# Patient Record
Sex: Male | Born: 1980 | Race: White | Hispanic: No | Marital: Single | Smoking: Current every day smoker
Health system: Southern US, Community
[De-identification: ages and names within clinical notes are randomized; demographics above are authoritative.]

## PROBLEM LIST (undated history)

## (undated) DIAGNOSIS — F1910 Other psychoactive substance abuse, uncomplicated: Secondary | ICD-10-CM

## (undated) DIAGNOSIS — F329 Major depressive disorder, single episode, unspecified: Secondary | ICD-10-CM

## (undated) DIAGNOSIS — K7460 Unspecified cirrhosis of liver: Secondary | ICD-10-CM

## (undated) DIAGNOSIS — M549 Dorsalgia, unspecified: Secondary | ICD-10-CM

## (undated) DIAGNOSIS — R0789 Other chest pain: Secondary | ICD-10-CM

## (undated) DIAGNOSIS — T148XXA Other injury of unspecified body region, initial encounter: Secondary | ICD-10-CM

## (undated) DIAGNOSIS — G8929 Other chronic pain: Secondary | ICD-10-CM

## (undated) DIAGNOSIS — F32A Depression, unspecified: Secondary | ICD-10-CM

## (undated) DIAGNOSIS — M25569 Pain in unspecified knee: Secondary | ICD-10-CM

---

## 2003-08-09 ENCOUNTER — Emergency Department (HOSPITAL_COMMUNITY): Admission: EM | Admit: 2003-08-09 | Discharge: 2003-08-09 | Payer: Self-pay | Admitting: Emergency Medicine

## 2004-11-17 ENCOUNTER — Emergency Department (HOSPITAL_COMMUNITY): Admission: EM | Admit: 2004-11-17 | Discharge: 2004-11-17 | Payer: Self-pay | Admitting: Emergency Medicine

## 2004-12-07 ENCOUNTER — Emergency Department (HOSPITAL_COMMUNITY): Admission: EM | Admit: 2004-12-07 | Discharge: 2004-12-07 | Payer: Self-pay | Admitting: *Deleted

## 2006-01-22 ENCOUNTER — Emergency Department (HOSPITAL_COMMUNITY): Admission: EM | Admit: 2006-01-22 | Discharge: 2006-01-22 | Payer: Self-pay | Admitting: Emergency Medicine

## 2006-07-16 ENCOUNTER — Emergency Department (HOSPITAL_COMMUNITY): Admission: EM | Admit: 2006-07-16 | Discharge: 2006-07-16 | Payer: Self-pay | Admitting: Emergency Medicine

## 2007-01-12 ENCOUNTER — Emergency Department (HOSPITAL_COMMUNITY): Admission: EM | Admit: 2007-01-12 | Discharge: 2007-01-12 | Payer: Self-pay | Admitting: Emergency Medicine

## 2007-04-11 ENCOUNTER — Emergency Department (HOSPITAL_COMMUNITY): Admission: EM | Admit: 2007-04-11 | Discharge: 2007-04-11 | Payer: Self-pay | Admitting: Emergency Medicine

## 2007-04-12 ENCOUNTER — Emergency Department (HOSPITAL_COMMUNITY): Admission: EM | Admit: 2007-04-12 | Discharge: 2007-04-12 | Payer: Self-pay | Admitting: Emergency Medicine

## 2007-04-17 ENCOUNTER — Emergency Department (HOSPITAL_COMMUNITY): Admission: EM | Admit: 2007-04-17 | Discharge: 2007-04-17 | Payer: Self-pay | Admitting: Emergency Medicine

## 2007-04-22 ENCOUNTER — Emergency Department (HOSPITAL_COMMUNITY): Admission: EM | Admit: 2007-04-22 | Discharge: 2007-04-22 | Payer: Self-pay | Admitting: Emergency Medicine

## 2007-07-15 ENCOUNTER — Emergency Department (HOSPITAL_COMMUNITY): Admission: EM | Admit: 2007-07-15 | Discharge: 2007-07-15 | Payer: Self-pay | Admitting: Emergency Medicine

## 2007-08-20 ENCOUNTER — Emergency Department (HOSPITAL_COMMUNITY): Admission: EM | Admit: 2007-08-20 | Discharge: 2007-08-20 | Payer: Self-pay | Admitting: Emergency Medicine

## 2007-09-17 ENCOUNTER — Emergency Department (HOSPITAL_COMMUNITY): Admission: EM | Admit: 2007-09-17 | Discharge: 2007-09-17 | Payer: Self-pay | Admitting: Emergency Medicine

## 2008-08-19 ENCOUNTER — Emergency Department (HOSPITAL_COMMUNITY): Admission: EM | Admit: 2008-08-19 | Discharge: 2008-08-19 | Payer: Self-pay | Admitting: Emergency Medicine

## 2008-08-22 ENCOUNTER — Emergency Department (HOSPITAL_COMMUNITY): Admission: EM | Admit: 2008-08-22 | Discharge: 2008-08-22 | Payer: Self-pay | Admitting: Emergency Medicine

## 2008-09-10 ENCOUNTER — Emergency Department (HOSPITAL_COMMUNITY): Admission: EM | Admit: 2008-09-10 | Discharge: 2008-09-10 | Payer: Self-pay | Admitting: Emergency Medicine

## 2008-09-16 ENCOUNTER — Emergency Department (HOSPITAL_COMMUNITY): Admission: EM | Admit: 2008-09-16 | Discharge: 2008-09-16 | Payer: Self-pay | Admitting: Emergency Medicine

## 2008-09-18 ENCOUNTER — Emergency Department (HOSPITAL_COMMUNITY): Admission: EM | Admit: 2008-09-18 | Discharge: 2008-09-18 | Payer: Self-pay | Admitting: Emergency Medicine

## 2009-01-05 ENCOUNTER — Emergency Department (HOSPITAL_COMMUNITY): Admission: EM | Admit: 2009-01-05 | Discharge: 2009-01-05 | Payer: Self-pay | Admitting: Emergency Medicine

## 2009-01-17 ENCOUNTER — Emergency Department (HOSPITAL_COMMUNITY): Admission: EM | Admit: 2009-01-17 | Discharge: 2009-01-17 | Payer: Self-pay | Admitting: Emergency Medicine

## 2009-02-02 ENCOUNTER — Emergency Department (HOSPITAL_COMMUNITY): Admission: EM | Admit: 2009-02-02 | Discharge: 2009-02-02 | Payer: Self-pay | Admitting: Emergency Medicine

## 2009-02-14 ENCOUNTER — Emergency Department (HOSPITAL_COMMUNITY): Admission: EM | Admit: 2009-02-14 | Discharge: 2009-02-14 | Payer: Self-pay | Admitting: Emergency Medicine

## 2009-02-17 ENCOUNTER — Emergency Department (HOSPITAL_COMMUNITY): Admission: EM | Admit: 2009-02-17 | Discharge: 2009-02-17 | Payer: Self-pay | Admitting: Emergency Medicine

## 2009-02-26 ENCOUNTER — Emergency Department (HOSPITAL_COMMUNITY): Admission: EM | Admit: 2009-02-26 | Discharge: 2009-02-26 | Payer: Self-pay | Admitting: Emergency Medicine

## 2009-04-05 ENCOUNTER — Emergency Department (HOSPITAL_COMMUNITY): Admission: EM | Admit: 2009-04-05 | Discharge: 2009-04-05 | Payer: Self-pay | Admitting: Emergency Medicine

## 2009-04-09 ENCOUNTER — Emergency Department (HOSPITAL_COMMUNITY): Admission: EM | Admit: 2009-04-09 | Discharge: 2009-04-09 | Payer: Self-pay | Admitting: Emergency Medicine

## 2009-04-10 ENCOUNTER — Emergency Department (HOSPITAL_COMMUNITY): Admission: EM | Admit: 2009-04-10 | Discharge: 2009-04-10 | Payer: Self-pay | Admitting: Emergency Medicine

## 2009-07-26 ENCOUNTER — Emergency Department (HOSPITAL_COMMUNITY): Admission: EM | Admit: 2009-07-26 | Discharge: 2009-07-26 | Payer: Self-pay | Admitting: Emergency Medicine

## 2009-08-24 ENCOUNTER — Emergency Department (HOSPITAL_COMMUNITY): Admission: EM | Admit: 2009-08-24 | Discharge: 2009-08-24 | Payer: Self-pay | Admitting: Emergency Medicine

## 2010-02-24 ENCOUNTER — Emergency Department (HOSPITAL_COMMUNITY): Admission: EM | Admit: 2010-02-24 | Discharge: 2010-02-24 | Payer: Self-pay | Admitting: Emergency Medicine

## 2010-05-18 ENCOUNTER — Emergency Department (HOSPITAL_COMMUNITY): Admission: EM | Admit: 2010-05-18 | Discharge: 2010-05-18 | Payer: Self-pay | Admitting: Emergency Medicine

## 2010-07-08 ENCOUNTER — Emergency Department (HOSPITAL_COMMUNITY)
Admission: EM | Admit: 2010-07-08 | Discharge: 2010-07-08 | Payer: BC Managed Care – PPO | Source: Home / Self Care | Admitting: Emergency Medicine

## 2010-07-26 ENCOUNTER — Emergency Department (HOSPITAL_COMMUNITY)
Admission: EM | Admit: 2010-07-26 | Discharge: 2010-07-26 | Disposition: A | Discharge status: Home / Self Care | Payer: Self-pay | Attending: Emergency Medicine | Admitting: Emergency Medicine

## 2010-07-26 DIAGNOSIS — G8929 Other chronic pain: Secondary | ICD-10-CM | POA: Insufficient documentation

## 2010-07-26 DIAGNOSIS — S335XXA Sprain of ligaments of lumbar spine, initial encounter: Secondary | ICD-10-CM | POA: Insufficient documentation

## 2010-07-26 DIAGNOSIS — X500XXA Overexertion from strenuous movement or load, initial encounter: Secondary | ICD-10-CM | POA: Insufficient documentation

## 2010-07-26 DIAGNOSIS — Y9269 Other specified industrial and construction area as the place of occurrence of the external cause: Secondary | ICD-10-CM | POA: Insufficient documentation

## 2010-10-01 LAB — CULTURE, ROUTINE-ABSCESS

## 2010-10-26 ENCOUNTER — Emergency Department (HOSPITAL_COMMUNITY)
Admission: EM | Admit: 2010-10-26 | Discharge: 2010-10-26 | Disposition: A | Discharge status: Home / Self Care | Payer: Self-pay | Attending: Emergency Medicine | Admitting: Emergency Medicine

## 2010-10-26 DIAGNOSIS — X503XXA Overexertion from repetitive movements, initial encounter: Secondary | ICD-10-CM | POA: Insufficient documentation

## 2010-10-26 DIAGNOSIS — S335XXA Sprain of ligaments of lumbar spine, initial encounter: Secondary | ICD-10-CM | POA: Insufficient documentation

## 2010-11-05 ENCOUNTER — Emergency Department (HOSPITAL_COMMUNITY): Payer: Self-pay

## 2010-11-05 ENCOUNTER — Emergency Department (HOSPITAL_COMMUNITY)
Admission: EM | Admit: 2010-11-05 | Discharge: 2010-11-05 | Disposition: A | Discharge status: Home / Self Care | Payer: Self-pay | Attending: Emergency Medicine | Admitting: Emergency Medicine

## 2010-11-05 DIAGNOSIS — S93409A Sprain of unspecified ligament of unspecified ankle, initial encounter: Secondary | ICD-10-CM | POA: Insufficient documentation

## 2010-11-05 DIAGNOSIS — Y9269 Other specified industrial and construction area as the place of occurrence of the external cause: Secondary | ICD-10-CM | POA: Insufficient documentation

## 2010-11-05 DIAGNOSIS — W138XXA Fall from, out of or through other building or structure, initial encounter: Secondary | ICD-10-CM | POA: Insufficient documentation

## 2010-12-16 ENCOUNTER — Emergency Department (HOSPITAL_COMMUNITY)
Admission: EM | Admit: 2010-12-16 | Discharge: 2010-12-16 | Disposition: A | Discharge status: Home / Self Care | Payer: Self-pay | Attending: Emergency Medicine | Admitting: Emergency Medicine

## 2010-12-16 ENCOUNTER — Emergency Department (HOSPITAL_COMMUNITY): Payer: Self-pay

## 2010-12-16 DIAGNOSIS — IMO0002 Reserved for concepts with insufficient information to code with codable children: Secondary | ICD-10-CM | POA: Insufficient documentation

## 2010-12-16 DIAGNOSIS — M545 Low back pain, unspecified: Secondary | ICD-10-CM | POA: Insufficient documentation

## 2010-12-16 DIAGNOSIS — W1789XA Other fall from one level to another, initial encounter: Secondary | ICD-10-CM | POA: Insufficient documentation

## 2010-12-16 DIAGNOSIS — Y9269 Other specified industrial and construction area as the place of occurrence of the external cause: Secondary | ICD-10-CM | POA: Insufficient documentation

## 2010-12-16 DIAGNOSIS — K7460 Unspecified cirrhosis of liver: Secondary | ICD-10-CM | POA: Insufficient documentation

## 2011-01-04 ENCOUNTER — Encounter: Payer: Self-pay | Admitting: *Deleted

## 2011-01-04 ENCOUNTER — Emergency Department (HOSPITAL_COMMUNITY)
Admission: EM | Admit: 2011-01-04 | Discharge: 2011-01-04 | Disposition: A | Discharge status: Home / Self Care | Payer: Self-pay | Attending: Emergency Medicine | Admitting: Emergency Medicine

## 2011-01-04 DIAGNOSIS — F17200 Nicotine dependence, unspecified, uncomplicated: Secondary | ICD-10-CM | POA: Insufficient documentation

## 2011-01-04 DIAGNOSIS — M545 Low back pain, unspecified: Secondary | ICD-10-CM | POA: Insufficient documentation

## 2011-01-04 DIAGNOSIS — M549 Dorsalgia, unspecified: Secondary | ICD-10-CM

## 2011-01-04 MED ORDER — NAPROXEN 500 MG PO TABS: 500.0000 mg | ORAL_TABLET | Freq: Two times a day (BID) | ORAL | Status: DC

## 2011-01-04 MED ORDER — NAPROXEN 250 MG PO TABS
500.0000 mg | ORAL_TABLET | Freq: Once | ORAL | Status: AC
  Administered 2011-01-04: 500 mg via ORAL
  Filled 2011-01-04: qty 2

## 2011-01-04 NOTE — ED Notes (Signed)
Pt awaiting md, reports having low back pain for unknown length of time, unknown injury

## 2011-01-04 NOTE — ED Provider Notes (Signed)
History     Chief Complaint  Patient presents with  . Back Pain    pt c/o low back pain   Patient is a 30 y.o. male presenting with back pain. The history is provided by the patient.  Back Pain  This is a new problem. The current episode started 12 to 24 hours ago. The problem occurs constantly. 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 The pain is present in the lumbar spine. The quality of the pain is described as aching. The pain does not radiate. The pain is at a severity of 7/10. The pain is moderate. The symptoms are aggravated by bending and certain positions. Associated symptoms include numbness. Pertinent negatives include no headaches, no bowel incontinence, no perianal numbness, no bladder incontinence, no leg pain, no paresthesias, no paresis and no tingling. He has tried nothing for the symptoms.    History reviewed. No pertinent past medical history.  History reviewed. No pertinent past surgical history.  History reviewed. No pertinent family history.  History  Substance Use Topics  . Smoking status: Current Everyday Smoker  . Smokeless tobacco: Not on file  . Alcohol Use: Yes      Review of Systems  Gastrointestinal: Negative for bowel incontinence.  Genitourinary: Negative for bladder incontinence.  Musculoskeletal: Positive for back pain.  Neurological: Positive for numbness. Negative for tingling, headaches and paresthesias.  All other systems reviewed and are negative.    Physical Exam  BP 135/82  Pulse 85  Temp 98.6 F (37 C)  Resp 20  Ht 5\' 9"  (1.753 m)  Wt 160 lb (72.576 kg)  BMI 23.63 kg/m2  SpO2 99%  Physical Exam  Constitutional: He is oriented to person, place, and time. He appears well-developed and well-nourished.  HENT:  Head: Normocephalic and atraumatic.  Eyes: EOM are normal.  Neck: Normal range of motion. Neck supple.   Cardiovascular: Normal rate and normal heart sounds.   Pulmonary/Chest: Effort normal.  Abdominal: Soft.  Musculoskeletal: Normal range of motion.       Back with no spinal pain to percussion or palpation. Paraspinal muscle tenderness in lumbar region  Neurological: He is oriented to person, place, and time.  Skin: Skin is warm.    ED Course  Procedures  MDM       Nicoletta Dress. Colon Branch, MD 01/04/11 1610

## 2011-01-11 ENCOUNTER — Emergency Department (HOSPITAL_COMMUNITY)
Admission: EM | Admit: 2011-01-11 | Discharge: 2011-01-11 | Disposition: A | Discharge status: Home / Self Care | Payer: Self-pay | Attending: Emergency Medicine | Admitting: Emergency Medicine

## 2011-01-11 ENCOUNTER — Encounter (HOSPITAL_COMMUNITY): Payer: Self-pay | Admitting: *Deleted

## 2011-01-11 DIAGNOSIS — K089 Disorder of teeth and supporting structures, unspecified: Secondary | ICD-10-CM | POA: Insufficient documentation

## 2011-01-11 DIAGNOSIS — F17200 Nicotine dependence, unspecified, uncomplicated: Secondary | ICD-10-CM | POA: Insufficient documentation

## 2011-01-11 DIAGNOSIS — K0889 Other specified disorders of teeth and supporting structures: Secondary | ICD-10-CM

## 2011-01-11 DIAGNOSIS — K029 Dental caries, unspecified: Secondary | ICD-10-CM | POA: Insufficient documentation

## 2011-01-11 MED ORDER — AMOXICILLIN 250 MG PO CAPS
1000.0000 mg | ORAL_CAPSULE | Freq: Once | ORAL | Status: AC
  Administered 2011-01-11: 1000 mg via ORAL
  Filled 2011-01-11: qty 4

## 2011-01-11 MED ORDER — OXYCODONE HCL 10 MG PO TABS: 10.0000 mg | ORAL_TABLET | Freq: Two times a day (BID) | ORAL | Status: DC

## 2011-01-11 MED ORDER — OXYCODONE HCL 5 MG PO TABS
10.0000 mg | ORAL_TABLET | Freq: Once | ORAL | Status: AC
  Administered 2011-01-11: 10 mg via ORAL
  Filled 2011-01-11: qty 2

## 2011-01-11 MED ORDER — AMOXICILLIN 500 MG PO CAPS: 500.0000 mg | ORAL_CAPSULE | Freq: Three times a day (TID) | ORAL | Status: AC

## 2011-01-11 NOTE — ED Notes (Signed)
Pt c/o right upper tooth pain x 1 day

## 2011-01-11 NOTE — ED Provider Notes (Addendum)
History    Complains of pain in right upper quadrant of oral cavity. Roy [name] has known caries and poor dental health. No fever or chills or stiffnecked. Has taken nothing at home. Pain is moderate. Chief Complaint  Roy [name] presents with  . Dental Pain   Roy [name] is a 30 y.o. male presenting with tooth pain. The history is provided by the Roy [name].  Dental PainThe symptoms began yesterday.    History reviewed. No pertinent past medical history.  History reviewed. No pertinent past surgical history.  History reviewed. No pertinent family history.  History  Substance Use Topics  . Smoking status: Current Everyday Smoker  . Smokeless tobacco: Not on file  . Alcohol Use: Yes      Review of Systems  All other systems reviewed and are negative.    Physical Exam  BP 124/70  Pulse 95  Temp(Src) 98.9 F (37.2 C) (Oral)  Resp 18  Ht 5\' 9"  (1.753 m)  Wt 160 lb (72.576 kg)  BMI 23.63 kg/m2  SpO2 100%  Physical Exam  Nursing note and vitals reviewed. Constitutional: He is oriented to person, place, and time. He appears well-developed and well-nourished.  HENT:  Head: Normocephalic and atraumatic.       Poor dentition. Evidence of caries throughout mouth particularly in the right upper teeth  Eyes: Conjunctivae and EOM are normal. Pupils are equal, round, and reactive to light.  Neck: Normal range of motion. Neck supple.  Cardiovascular: Normal rate and regular rhythm.   Pulmonary/Chest: Effort normal and breath sounds normal.  Abdominal: Soft. Bowel sounds are normal.  Musculoskeletal: Normal range of motion.  Neurological: He is alert and oriented to person, place, and time.  Skin: Skin is warm and dry.  Psychiatric: He has a normal mood and affect.    ED Course  Procedures  MDM Will Rx antibiotic and pain medicine followup with dentist of choice     Donnetta Hutching, MD 01/11/11 1610  Donnetta Hutching, MD 01/29/11 1536

## 2011-01-18 ENCOUNTER — Emergency Department (HOSPITAL_COMMUNITY)
Admission: EM | Admit: 2011-01-18 | Discharge: 2011-01-18 | Disposition: A | Discharge status: Home / Self Care | Payer: Self-pay | Attending: Emergency Medicine | Admitting: Emergency Medicine

## 2011-01-18 ENCOUNTER — Encounter (HOSPITAL_COMMUNITY): Payer: Self-pay | Admitting: Emergency Medicine

## 2011-01-18 DIAGNOSIS — K029 Dental caries, unspecified: Secondary | ICD-10-CM | POA: Insufficient documentation

## 2011-01-18 DIAGNOSIS — K089 Disorder of teeth and supporting structures, unspecified: Secondary | ICD-10-CM | POA: Insufficient documentation

## 2011-01-18 DIAGNOSIS — F17200 Nicotine dependence, unspecified, uncomplicated: Secondary | ICD-10-CM | POA: Insufficient documentation

## 2011-01-18 DIAGNOSIS — K0889 Other specified disorders of teeth and supporting structures: Secondary | ICD-10-CM

## 2011-01-18 MED ORDER — OXYCODONE-ACETAMINOPHEN 5-325 MG PO TABS: 2.0000 | ORAL_TABLET | ORAL | Status: DC | PRN

## 2011-01-18 MED ORDER — OXYCODONE HCL 10 MG PO TB12: 10.0000 mg | ORAL_TABLET | Freq: Two times a day (BID) | ORAL | Status: AC

## 2011-01-18 NOTE — ED Provider Notes (Signed)
History     Chief Complaint  Patient presents with  . Dental Pain   HPI Comments: Patient was seen and given amoxicillin and oxycodone for dental pain and caries. Patien tis supposed to have an appointment with his dentist Friday. Wore shorts with a open pocket on his pants having lost his last 9 oxycodone. Here for replacement.  Patient is a 30 y.o. male presenting with tooth pain. The history is provided by the patient.  Dental PainPrimary symptoms comment: dental pain Episode onset: 2 weeks. The symptoms are unchanged.  Additional symptoms include: dental sensitivity to temperature and gum swelling.    History reviewed. No pertinent past medical history.  History reviewed. No pertinent past surgical history.  No family history on file.  History  Substance Use Topics  . Smoking status: Current Some Day Smoker  . Smokeless tobacco: Not on file  . Alcohol Use: No      Review of Systems  All other systems reviewed and are negative.    Physical Exam  BP 131/79  Pulse 73  Temp(Src) 99.1 F (37.3 C) (Oral)  Resp 16  Ht 5\' 9"  (1.753 m)  Wt 160 lb (72.576 kg)  BMI 23.63 kg/m2  SpO2 99%  Physical Exam  Nursing note and vitals reviewed. Constitutional: He is oriented to person, place, and time. He appears well-developed and well-nourished. No distress.  HENT:  Head: Normocephalic.  Mouth/Throat: Oropharynx is clear and moist.       Widespread dental caries.  Eyes: EOM are normal. Pupils are equal, round, and reactive to light.  Cardiovascular: Normal rate.   Pulmonary/Chest: Effort normal.  Abdominal: Soft.  Musculoskeletal: Normal range of motion.  Neurological: He is alert and oriented to person, place, and time.  Skin: Skin is warm and dry.  Psychiatric: He has a normal mood and affect.    ED Course  Procedures  MDM Normal policy is to not replace lost or stolen pain medicines. Patient does not have PMD, has a dentist appt on Friday. He will return to the  ER each shift if not accomodated. Will provide #9 pills.     Nicoletta Dress. Colon Branch, MD 01/18/11 [phone]

## 2011-01-18 NOTE — ED Notes (Signed)
Pt here for toothache.

## 2011-01-18 NOTE — ED Notes (Signed)
Patient c/o right upper tooth ache. 

## 2011-01-29 ENCOUNTER — Emergency Department (HOSPITAL_COMMUNITY)
Admission: EM | Admit: 2011-01-29 | Discharge: 2011-01-29 | Disposition: A | Discharge status: Home / Self Care | Payer: Self-pay | Attending: Emergency Medicine | Admitting: Emergency Medicine

## 2011-01-29 ENCOUNTER — Encounter (HOSPITAL_COMMUNITY): Payer: Self-pay | Admitting: *Deleted

## 2011-01-29 DIAGNOSIS — K0889 Other specified disorders of teeth and supporting structures: Secondary | ICD-10-CM

## 2011-01-29 DIAGNOSIS — K029 Dental caries, unspecified: Secondary | ICD-10-CM | POA: Insufficient documentation

## 2011-01-29 DIAGNOSIS — K0381 Cracked tooth: Secondary | ICD-10-CM | POA: Insufficient documentation

## 2011-01-29 DIAGNOSIS — K089 Disorder of teeth and supporting structures, unspecified: Secondary | ICD-10-CM | POA: Insufficient documentation

## 2011-01-29 DIAGNOSIS — F17200 Nicotine dependence, unspecified, uncomplicated: Secondary | ICD-10-CM | POA: Insufficient documentation

## 2011-01-29 MED ORDER — OXYCODONE HCL 5 MG PO TABS
5.0000 mg | ORAL_TABLET | Freq: Once | ORAL | Status: AC
  Administered 2011-01-29: 5 mg via ORAL
  Filled 2011-01-29: qty 1

## 2011-01-29 NOTE — ED Notes (Signed)
Pt self ambulated out with a steady gait. [facility] help

## 2011-01-29 NOTE — ED Notes (Signed)
Pt c/o right upper tooth pain; pt states the tooth has a hole in it

## 2011-01-29 NOTE — ED Provider Notes (Signed)
History     CSN: [id number] Arrival date & time: 01/29/2011  1:47 AM  Chief Complaint  Patient presents with  . Dental Pain    top right   HPI Comments: Seen 0320  Patient is a 30 y.o. male presenting with tooth pain. The history is provided by the patient.  Dental PainThe primary symptoms include mouth pain. Primary symptoms comment: dental pain right upper teeth Episode onset: months. The symptoms are unchanged. The symptoms are chronic. The symptoms occur frequently.  Additional symptoms include: dental sensitivity to temperature, gum tenderness and jaw pain. Additional symptoms do not include: trouble swallowing, dry mouth and taste disturbance.    History reviewed. No pertinent past medical history.  History reviewed. No pertinent past surgical history.  History reviewed. No pertinent family history.  History  Substance Use Topics  . Smoking status: Current Some Day Smoker  . Smokeless tobacco: Not on file  . Alcohol Use: No      Review of Systems  HENT: Negative for trouble swallowing.   All other systems reviewed and are negative.    Physical Exam  BP 131/76  Pulse 87  Temp(Src) 98.8 F (37.1 C) (Oral)  Resp 18  Ht 5\' 9"  (1.753 m)  Wt 160 lb (72.576 kg)  BMI 23.63 kg/m2  SpO2 100%  Physical Exam  Nursing note and vitals reviewed. Constitutional: He is oriented to person, place, and time. He appears well-developed and well-nourished.  HENT:  Head: Normocephalic and atraumatic.       Poor dental hygiene. Multiple caries. Broken teeth, decayed teeth   Eyes: EOM are normal.  Cardiovascular: Normal rate.   Pulmonary/Chest: Effort normal.  Musculoskeletal: Normal range of motion.  Neurological: He is alert and oriented to person, place, and time.  Skin: Skin is dry.    ED Course  Procedures  MDM Reviewed nurses notes and vital signs      Nicoletta Dress. Colon Branch, MD 01/29/11 [id number]

## 2011-02-09 ENCOUNTER — Emergency Department (HOSPITAL_COMMUNITY)
Admission: EM | Admit: 2011-02-09 | Discharge: 2011-02-09 | Disposition: A | Discharge status: Home / Self Care | Payer: Self-pay | Attending: Emergency Medicine | Admitting: Emergency Medicine

## 2011-02-09 ENCOUNTER — Encounter (HOSPITAL_COMMUNITY): Payer: Self-pay | Admitting: *Deleted

## 2011-02-09 DIAGNOSIS — K0889 Other specified disorders of teeth and supporting structures: Secondary | ICD-10-CM

## 2011-02-09 DIAGNOSIS — F17200 Nicotine dependence, unspecified, uncomplicated: Secondary | ICD-10-CM | POA: Insufficient documentation

## 2011-02-09 DIAGNOSIS — K089 Disorder of teeth and supporting structures, unspecified: Secondary | ICD-10-CM | POA: Insufficient documentation

## 2011-02-09 MED ORDER — OXYCODONE HCL 10 MG PO TABS: 1.0000 | ORAL_TABLET | Freq: Three times a day (TID) | ORAL | Status: DC

## 2011-02-09 MED ORDER — AMOXICILLIN 500 MG PO CAPS: 500.0000 mg | ORAL_CAPSULE | Freq: Three times a day (TID) | ORAL | Status: AC

## 2011-02-09 MED ORDER — OXYCODONE HCL 5 MG PO TABS
10.0000 mg | ORAL_TABLET | Freq: Once | ORAL | Status: AC
  Administered 2011-02-09: 10 mg via ORAL
  Filled 2011-02-09: qty 2

## 2011-02-09 NOTE — ED Notes (Signed)
toothache

## 2011-02-09 NOTE — ED Notes (Signed)
Pt self am out with a steady gait stating no needs

## 2011-02-09 NOTE — ED Provider Notes (Signed)
History    chief complaint dental pain History of present illness: Complains of right upper tooth pain for several weeks. Has appointment with dentist in 2 weeks and [facility]. 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 No fever, chills, stiff neck. CSN: [id number] Arrival date & time: 02/09/2011  1:28 AM  Chief Complaint  Patient presents with  . Dental Pain   HPI  History reviewed. No pertinent past medical history.  History reviewed. No pertinent past surgical history.  History reviewed. No pertinent family history.  History  Substance Use Topics  . Smoking status: Current Some Day Smoker  . Smokeless tobacco: Not on file  . Alcohol Use: No      Review of Systems  All other systems reviewed and are negative.    Physical Exam  BP 128/83  Pulse 98  Temp(Src) 99.2 F (37.3 C) (Oral)  Resp 18  Wt 160 lb (72.576 kg)  SpO2 99%  Physical Exam  Nursing note and vitals reviewed. Constitutional: He is oriented to person, place, and time. He appears well-developed and well-nourished.  HENT:  Head: Normocephalic and atraumatic.       Poor dentition throughout mouth. Tenderness in right upper teeth and gingiva.  Eyes: Conjunctivae and EOM are normal. Pupils are equal, round, and reactive to light.  Neck: Normal range of motion. Neck supple.  Musculoskeletal: Normal range of motion.  Neurological: He is alert and oriented to person, place, and time.  Skin: Skin is warm and dry.  Psychiatric: He has a normal mood and affect.    ED Course  Procedures  MDM Rx pain meds and antibiotics. Follow up with dentistry.      Donnetta Hutching, MD 02/09/11 [phone]

## 2011-03-02 ENCOUNTER — Encounter (HOSPITAL_COMMUNITY): Payer: Self-pay | Admitting: *Deleted

## 2011-03-02 ENCOUNTER — Emergency Department (HOSPITAL_COMMUNITY)
Admission: EM | Admit: 2011-03-02 | Discharge: 2011-03-02 | Disposition: A | Discharge status: Home / Self Care | Payer: Self-pay | Attending: Emergency Medicine | Admitting: Emergency Medicine

## 2011-03-02 DIAGNOSIS — K029 Dental caries, unspecified: Secondary | ICD-10-CM | POA: Insufficient documentation

## 2011-03-02 DIAGNOSIS — K0889 Other specified disorders of teeth and supporting structures: Secondary | ICD-10-CM

## 2011-03-02 DIAGNOSIS — F17200 Nicotine dependence, unspecified, uncomplicated: Secondary | ICD-10-CM | POA: Insufficient documentation

## 2011-03-02 DIAGNOSIS — K089 Disorder of teeth and supporting structures, unspecified: Secondary | ICD-10-CM | POA: Insufficient documentation

## 2011-03-02 MED ORDER — OXYCODONE HCL 5 MG PO TABS
10.0000 mg | ORAL_TABLET | Freq: Once | ORAL | Status: AC
  Administered 2011-03-02: 10 mg via ORAL
  Filled 2011-03-02: qty 2

## 2011-03-02 NOTE — ED Notes (Signed)
Toothache since yesterday.  Says he has seen a dentist and plans to have it pulled

## 2011-03-02 NOTE — ED Notes (Signed)
Toothache, rt upper tooth.

## 2011-03-02 NOTE — ED Provider Notes (Signed)
History     CSN: [id number] Arrival date & time: 03/02/2011  4:15 AM  Chief Complaint  Patient presents with  . Dental Pain   HPI Comments: Seen 0445. Patient has next dentist appointment on 03/19/11.  Patient is a 30 y.o. male presenting with tooth pain. The history is provided by the patient.  Dental PainThe primary symptoms include mouth pain. Primary symptoms comment: Patient with multiple visits to the ER for dental pain. Was seen by dentist 02/16/11. Given Rx for antibiotics and #30 oxycodone 10 mg. Finished all pills last Saturday. Ate steak last night and has renewed pain to rright upper teeth. The symptoms began 2 to 6 hours ago. The symptoms are unchanged. The symptoms are recurrent.  Additional symptoms include: dental sensitivity to temperature and gum tenderness. Additional symptoms do not include: jaw pain, facial swelling and trouble swallowing.    History reviewed. No pertinent past medical history.  History reviewed. No pertinent past surgical history.  History reviewed. No pertinent family history.  History  Substance Use Topics  . Smoking status: Current Some Day Smoker  . Smokeless tobacco: Not on file  . Alcohol Use: No      Review of Systems  HENT: Negative for facial swelling and trouble swallowing.   All other systems reviewed and are negative.    Physical Exam  BP 125/82  Pulse 110  Temp(Src) 98.7 F (37.1 C) (Oral)  Resp 20  Wt 160 lb (72.576 kg)  SpO2 96%  Physical Exam  Nursing note and vitals reviewed. Constitutional: He is oriented to person, place, and time. He appears well-developed and well-nourished.  HENT:  Head: Normocephalic and atraumatic.       Poor dental condition. RU molar broken at gumline, no obvious abscess. Widespread dental decay.  Eyes: EOM are normal.  Neck: Normal range of motion.  Cardiovascular: Normal heart sounds and intact distal pulses.   Pulmonary/Chest: Effort normal and breath sounds normal.    Musculoskeletal: Normal range of motion.  Neurological: He is alert and oriented to person, place, and time.  Skin: Skin is warm and dry.    ED Course  Procedures  Patient with multiple visits for pain due to dental caries. Recently seen by a dentist and given antibiotics and analgesics which he finished. Next appt is 03/19/11. Here for pain. Offered him a single pill here, with follow up with the dentist tomorrow.  MDM Reviewed: previous chart, nursing note and vitals        Nicoletta Dress. Colon Branch, MD 03/02/11 [phone]

## 2011-03-04 ENCOUNTER — Encounter (HOSPITAL_COMMUNITY): Payer: Self-pay | Admitting: *Deleted

## 2011-03-04 ENCOUNTER — Emergency Department (HOSPITAL_COMMUNITY)
Admission: EM | Admit: 2011-03-04 | Discharge: 2011-03-04 | Disposition: A | Discharge status: Home / Self Care | Payer: Self-pay | Attending: Emergency Medicine | Admitting: Emergency Medicine

## 2011-03-04 DIAGNOSIS — K089 Disorder of teeth and supporting structures, unspecified: Secondary | ICD-10-CM | POA: Insufficient documentation

## 2011-03-04 DIAGNOSIS — K0889 Other specified disorders of teeth and supporting structures: Secondary | ICD-10-CM

## 2011-03-04 DIAGNOSIS — K029 Dental caries, unspecified: Secondary | ICD-10-CM | POA: Insufficient documentation

## 2011-03-04 MED ORDER — OXYCODONE-ACETAMINOPHEN 5-325 MG PO TABS
1.0000 | ORAL_TABLET | Freq: Once | ORAL | Status: AC
  Administered 2011-03-04: 1 via ORAL
  Filled 2011-03-04: qty 1

## 2011-03-04 MED ORDER — PENICILLIN V POTASSIUM 500 MG PO TABS: 500.0000 mg | ORAL_TABLET | Freq: Three times a day (TID) | ORAL | Status: AC

## 2011-03-04 NOTE — ED Notes (Signed)
Toothache since Sunday

## 2011-03-04 NOTE — ED Provider Notes (Signed)
History     CSN: [id number] Arrival date & time: 03/04/2011 11:02 PM   Chief Complaint  Patient presents with  . Dental Pain     (Include location/radiation/quality/duration/timing/severity/associated sxs/prior treatment) HPI Comments: Dental pain, onset several months ago, waxes and wanes, location right upper jaw. States that he is going to see a dentist in the next week and a half, until that time has no more medication at home. He had previously been treated with oxycodone which had improved his pain. He was seen 2 days ago when offered medications in the ER but no prescriptions to go home. He has allergies to ibuprofen and Toradol causing nausea. Symptoms are moderate, not associated with swelling or fevers.  Patient is a 30 y.o. male presenting with tooth pain. The history is provided by the patient and medical records.  Dental PainPrimary symptoms do not include fever or sore throat.  Additional symptoms do not include: facial swelling and trouble swallowing.     History reviewed. No pertinent past medical history.   History reviewed. No pertinent past surgical history.  History reviewed. No pertinent family history.  History  Substance Use Topics  . Smoking status: Current Some Day Smoker  . Smokeless tobacco: Not on file  . Alcohol Use: No      Review of Systems  Constitutional: Negative for fever and chills.  HENT: Positive for dental problem. Negative for sore throat, facial swelling, trouble swallowing and voice change.        Toothache  Gastrointestinal: Negative for nausea and vomiting.    Allergies  Acetaminophen; Darvocet; Hydrocodone; Toradol; and Tramadol  Home Medications   Current Outpatient Rx  Name Route Sig Dispense Refill  . NAPROXEN 500 MG PO TABS Oral Take 1 tablet (500 mg total) by mouth 2 (two) times daily. 30 tablet 0  . OXYCODONE HCL 10 MG PO TABS Oral Take 1 tablet (10 mg total) by mouth 2 (two) times daily. 20 each 0  . OXYCODONE HCL  10 MG PO TABS Oral Take 1 tablet (10 mg total) by mouth 3 (three) times daily after meals. 25 each 0    Physical Exam    BP 140/84  Pulse 85  Temp(Src) 99.6 F (37.6 C) (Oral)  Resp 18  Wt 160 lb (72.576 kg)  SpO2 99%  Physical Exam  Nursing note and vitals reviewed. Constitutional: He appears well-developed and well-nourished. No distress.  HENT:  Head: Normocephalic and atraumatic.  Mouth/Throat: Oropharynx is clear and moist. No oropharyngeal exudate.       Dental Disease - widespread, significant, no associated abscesses. No asymmetry of the jaw  Eyes: Conjunctivae are normal. No scleral icterus.  Neck: Normal range of motion. Neck supple. No thyromegaly present.  Cardiovascular: Normal rate and regular rhythm.   Pulmonary/Chest: Effort normal and breath sounds normal.  Lymphadenopathy:    He has no cervical adenopathy.  Neurological: He is alert.  Skin: Skin is warm and dry. No rash noted. He is not diaphoretic.    ED Course  Procedures    MDM Overall well appearing with evidence of dental decay but no abscess. We'll refill antibiotic and treat with one dose of pain medication here. I have informed her that I will not give him a prescription for narcotic medications due to the amount that he is used to last 2 months without following up with his dentist.       Vida Roller, MD 03/04/11 2344

## 2011-03-08 ENCOUNTER — Encounter (HOSPITAL_COMMUNITY): Payer: Self-pay | Admitting: *Deleted

## 2011-03-08 ENCOUNTER — Emergency Department (HOSPITAL_COMMUNITY)
Admission: EM | Admit: 2011-03-08 | Discharge: 2011-03-08 | Disposition: A | Discharge status: Home / Self Care | Payer: Self-pay | Attending: Emergency Medicine | Admitting: Emergency Medicine

## 2011-03-08 DIAGNOSIS — K089 Disorder of teeth and supporting structures, unspecified: Secondary | ICD-10-CM | POA: Insufficient documentation

## 2011-03-08 DIAGNOSIS — K0889 Other specified disorders of teeth and supporting structures: Secondary | ICD-10-CM

## 2011-03-08 MED ORDER — OXYCODONE HCL 5 MG PO TABS
5.0000 mg | ORAL_TABLET | Freq: Once | ORAL | Status: AC
  Administered 2011-03-08: 5 mg via ORAL
  Filled 2011-03-08: qty 1

## 2011-03-08 MED ORDER — OXYCODONE HCL 5 MG PO TABS: 5.0000 mg | ORAL_TABLET | Freq: Four times a day (QID) | ORAL | Status: AC | PRN

## 2011-03-08 MED ORDER — CLINDAMYCIN HCL 150 MG PO CAPS
300.0000 mg | ORAL_CAPSULE | Freq: Once | ORAL | Status: AC
  Administered 2011-03-08: 300 mg via ORAL
  Filled 2011-03-08: qty 2

## 2011-03-08 MED ORDER — CLINDAMYCIN HCL 150 MG PO CAPS: 300.0000 mg | ORAL_CAPSULE | Freq: Three times a day (TID) | ORAL | Status: AC

## 2011-03-08 NOTE — ED Provider Notes (Signed)
History     CSN: [id number] Arrival date & time: 03/08/2011 12:41 AM   Chief Complaint  Patient presents with  . Dental Pain     (Include location/radiation/quality/duration/timing/severity/associated sxs/prior treatment) HPI Comments: Pt states he's been taking percocet without tylenol also taking penicillin but the oral swelling seems to be worsening.  Patient is a 30 y.o. male presenting with tooth pain. The history is provided by the patient. No language interpreter was used.  Dental PainThe primary symptoms include mouth pain. Primary symptoms do not include dental injury. The symptoms began 2 days ago. The symptoms are worsening. The symptoms occur constantly.  Additional symptoms do not include: facial swelling. Medical issues include: alcohol problem.     History reviewed. No pertinent past medical history.   History reviewed. No pertinent past surgical history.  History reviewed. No pertinent family history.  History  Substance Use Topics  . Smoking status: Current Some Day Smoker -- 0.5 packs/day    Types: Cigarettes  . Smokeless tobacco: Not on file  . Alcohol Use: No      Review of Systems  HENT: Positive for dental problem. Negative for facial swelling.   All other systems reviewed and are negative.    Allergies  Acetaminophen; Darvocet; Hydrocodone; Toradol; and Tramadol  Home Medications   Current Outpatient Rx  Name Route Sig Dispense Refill  . PENICILLIN V POTASSIUM 500 MG PO TABS Oral Take 1 tablet (500 mg total) by mouth 3 (three) times daily. 30 tablet 0  . NAPROXEN 500 MG PO TABS Oral Take 1 tablet (500 mg total) by mouth 2 (two) times daily. 30 tablet 0  . OXYCODONE HCL 10 MG PO TABS Oral Take 1 tablet (10 mg total) by mouth 2 (two) times daily. 20 each 0  . OXYCODONE HCL 10 MG PO TABS Oral Take 1 tablet (10 mg total) by mouth 3 (three) times daily after meals. 25 each 0    Physical Exam    BP 138/83  Pulse 100  Temp(Src) 97.8 F (36.6  C) (Oral)  Resp 20  Ht 5\' 9"  (1.753 m)  Wt 160 lb (72.576 kg)  BMI 23.63 kg/m2  SpO2 100%  Physical Exam  Nursing note and vitals reviewed. Constitutional: He is oriented to person, place, and time. Vital signs are normal. He appears well-developed and well-nourished. No distress.  HENT:  Head: Normocephalic and atraumatic.  Right Ear: External ear normal.  Left Ear: External ear normal.  Nose: Nose normal.  Mouth/Throat: Abnormal dentition. Dental abscesses and dental caries present. No oropharyngeal exudate.    Eyes: Conjunctivae and EOM are normal. Pupils are equal, round, and reactive to light. Right eye exhibits no discharge. Left eye exhibits no discharge. No scleral icterus.  Neck: Normal range of motion. Neck supple. No JVD present. No tracheal deviation present. No thyromegaly present.  Cardiovascular: Normal rate, regular rhythm, normal heart sounds, intact distal pulses and normal pulses.  Exam reveals no gallop and no friction rub.   No murmur heard. Pulmonary/Chest: Effort normal and breath sounds normal. No stridor. No respiratory distress. He has no wheezes. He has no rales. He exhibits no tenderness.  Abdominal: Soft. Normal appearance and bowel sounds are normal. He exhibits no distension and no mass. There is no tenderness. There is no rebound and no guarding.  Musculoskeletal: Normal range of motion. He exhibits no edema and no tenderness.  Lymphadenopathy:    He has no cervical adenopathy.  Neurological: He is alert and oriented to person,  place, and time. He has normal reflexes. No cranial nerve deficit. Coordination normal. GCS eye subscore is 4. GCS verbal subscore is 5. GCS motor subscore is 6.  Skin: Skin is warm and dry. No rash noted. He is not diaphoretic.  Psychiatric: He has a normal mood and affect. His speech is normal and behavior is normal. Judgment and thought content normal. Cognition and memory are normal.    ED Course  Procedures  Results for  orders placed during the hospital encounter of 09/16/08  CULTURE, ROUTINE-ABSCESS      Component Value Range   Specimen Description ABSCESS LEFT AXILLA     Special Requests NONE     Gram Stain       Value: MODERATE WBC PRESENT, PREDOMINANTLY PMN     NO SQUAMOUS EPITHELIAL CELLS SEEN     FEW GRAM POSITIVE COCCI     IN PAIRS IN CLUSTERS   Culture       Value: MODERATE METHICILLIN RESISTANT STAPHYLOCOCCUS AUREUS     Note: RIFAMPIN AND GENTAMICIN SHOULD NOT BE USED AS SINGLE DRUGS FOR TREATMENT OF STAPH INFECTIONS. This organism DOES NOT demonstrate inducible Clindamycin resistance in vitro. CRITICAL RESULT CALLED TO, READ BACK BY AND VERIFIED WITH: T. SILVIANO FM      04.01.10 10.50 BY PER[facility]   Report Status 09/19/2008 FINAL     Organism ID, Bacteria METHICILLIN RESISTANT STAPHYLOCOCCUS AUREUS     No results found.   No diagnosis found.   MDM        Worthy Rancher, PA 03/08/11 [phone]

## 2011-03-08 NOTE — ED Notes (Signed)
Patient with dental pain to right upper area x 1 week

## 2011-03-13 ENCOUNTER — Emergency Department (HOSPITAL_COMMUNITY)
Admission: EM | Admit: 2011-03-13 | Discharge: 2011-03-13 | Discharge status: Against Medical Advice | Payer: Self-pay | Attending: Emergency Medicine | Admitting: Emergency Medicine

## 2011-03-13 ENCOUNTER — Encounter (HOSPITAL_COMMUNITY): Payer: Self-pay | Admitting: Emergency Medicine

## 2011-03-13 DIAGNOSIS — Z888 Allergy status to other drugs, medicaments and biological substances status: Secondary | ICD-10-CM | POA: Insufficient documentation

## 2011-03-13 DIAGNOSIS — F17200 Nicotine dependence, unspecified, uncomplicated: Secondary | ICD-10-CM | POA: Insufficient documentation

## 2011-03-13 DIAGNOSIS — K089 Disorder of teeth and supporting structures, unspecified: Secondary | ICD-10-CM | POA: Insufficient documentation

## 2011-03-13 DIAGNOSIS — K0889 Other specified disorders of teeth and supporting structures: Secondary | ICD-10-CM

## 2011-03-13 MED ORDER — NAPROXEN 250 MG PO TABS
500.0000 mg | ORAL_TABLET | Freq: Once | ORAL | Status: DC
  Filled 2011-03-13: qty 2

## 2011-03-13 MED ORDER — OXYCODONE-ACETAMINOPHEN 5-325 MG PO TABS
1.0000 | ORAL_TABLET | Freq: Once | ORAL | Status: AC
  Administered 2011-03-13: 1 via ORAL

## 2011-03-13 MED ORDER — OXYCODONE-ACETAMINOPHEN 5-325 MG PO TABS
ORAL_TABLET | ORAL | Status: AC
  Filled 2011-03-13: qty 1

## 2011-03-13 NOTE — ED Notes (Signed)
Percocet not administerd due to pt allergy. Unable to correct in computer to show not given

## 2011-03-13 NOTE — ED Notes (Signed)
Pt refused to take naprosyn. Demanding oxycodone without tylenol. Explained that PA was informed of pt request and had ordered naprosyn instead. Pt states I will just take the percocet with tylenol then. Explained to pt that med could not be administered d/t allergy. Pt upset. States "Screw this. Im hurting bad. Im leaving." Pt ambulated out of faciliyt. Refused to sign for discharge instructions.

## 2011-03-13 NOTE — ED Provider Notes (Signed)
History     CSN: [id number] Arrival date & time: 03/13/2011  8:11 PM  Chief Complaint  Patient presents with  . Dental Pain    HPI  (Consider location/radiation/quality/duration/timing/severity/associated sxs/prior treatment)  Patient is a 30 y.o. male presenting with tooth pain. The history is provided by the patient.  Dental PainThe primary symptoms include mouth pain. Primary symptoms do not include shortness of breath or cough. The symptoms began more than 1 month ago. The symptoms are unchanged. The symptoms are chronic.  Additional symptoms include: dental sensitivity to temperature, gum swelling and gum tenderness. Additional symptoms do not include: nosebleeds. Medical issues include: smoking.    History reviewed. No pertinent past medical history.  History reviewed. No pertinent past surgical history.  History reviewed. No pertinent family history.  History  Substance Use Topics  . Smoking status: Current Some Day Smoker -- 0.5 packs/day    Types: Cigarettes  . Smokeless tobacco: Not on file  . Alcohol Use: No      Review of Systems  Review of Systems  Constitutional: Negative for activity change.       All ROS Neg except as noted in HPI  HENT: Positive for dental problem. Negative for nosebleeds and neck pain.   Eyes: Negative for photophobia and discharge.  Respiratory: Negative for cough, shortness of breath and wheezing.   Cardiovascular: Negative for chest pain and palpitations.  Gastrointestinal: Negative for abdominal pain and blood in stool.  Genitourinary: Negative for dysuria, frequency and hematuria.  Musculoskeletal: Negative for back pain and arthralgias.  Skin: Negative.   Neurological: Negative for dizziness, seizures and speech difficulty.  Psychiatric/Behavioral: Negative for hallucinations and confusion.    Allergies  Acetaminophen; Darvocet; Hydrocodone; Toradol; and Tramadol  Home Medications   Current Outpatient Rx  Name Route Sig  Dispense Refill  . CLINDAMYCIN HCL 150 MG PO CAPS Oral Take 2 capsules (300 mg total) by mouth 3 (three) times daily. 60 capsule 0  . OXYCODONE HCL 5 MG PO TABS Oral Take 1 tablet (5 mg total) by mouth every 6 (six) hours as needed for pain. 20 tablet 0  . OXYCODONE HCL 10 MG PO TABS Oral Take 1 tablet (10 mg total) by mouth 3 (three) times daily after meals. 25 each 0  . PENICILLIN V POTASSIUM 500 MG PO TABS Oral Take 1 tablet (500 mg total) by mouth 3 (three) times daily. 30 tablet 0  . NAPROXEN 500 MG PO TABS Oral Take 1 tablet (500 mg total) by mouth 2 (two) times daily. 30 tablet 0  . OXYCODONE HCL 10 MG PO TABS Oral Take 1 tablet (10 mg total) by mouth 2 (two) times daily. 20 each 0    Physical Exam    BP 137/82  Pulse 98  Temp(Src) 98.5 F (36.9 C) (Oral)  Resp 18  Ht 5\' 9"  (1.753 m)  Wt 160 lb (72.576 kg)  BMI 23.63 kg/m2  SpO2 100%  Physical Exam  Nursing note and vitals reviewed. Constitutional: He is oriented to person, place, and time. He appears well-developed and well-nourished.  Non-toxic appearance.  HENT:  Head: Normocephalic.  Right Ear: Tympanic membrane and external ear normal.  Left Ear: Tympanic membrane and external ear normal.       Wide spread dental caries. Mild to mod swelling of the gums.  Eyes: EOM and lids are normal. Pupils are equal, round, and reactive to light.  Neck: Normal range of motion. Neck supple. Carotid bruit is not present.  Cardiovascular:  Normal rate, regular rhythm, normal heart sounds, intact distal pulses and normal pulses.   Pulmonary/Chest: Breath sounds normal. No respiratory distress.  Abdominal: Soft. Bowel sounds are normal. There is no tenderness. There is no guarding.  Musculoskeletal: Normal range of motion.  Lymphadenopathy:       Head (right side): No submandibular adenopathy present.       Head (left side): No submandibular adenopathy present.    He has no cervical adenopathy.  Neurological: He is alert and oriented  to person, place, and time. He has normal strength. No cranial nerve deficit or sensory deficit.  Skin: Skin is warm and dry.  Psychiatric: He has a normal mood and affect. His speech is normal.    ED Course: Pt has been treated with narcotic pain medication 9/17, 9/13, 9/11, 8/21, 8/10, and 7/30. Instructed pt to finish his antibiotic. Advised pt he will need to see his dentist for dental pain management as the ED is not set up for chronic pain management.  Procedures (including critical care time)  Labs Reviewed - No data to display No results found.   1. Toothache      MDM I have reviewed nursing notes, vital signs, and all appropriate lab and imaging results for this patient.        Kathie Dike, [place] 03/13/11 2102

## 2011-03-13 NOTE — ED Notes (Signed)
Complaining of toothache to upper right side of mouth x 1 day

## 2011-03-14 DIAGNOSIS — F17200 Nicotine dependence, unspecified, uncomplicated: Secondary | ICD-10-CM | POA: Insufficient documentation

## 2011-03-14 DIAGNOSIS — K089 Disorder of teeth and supporting structures, unspecified: Secondary | ICD-10-CM | POA: Insufficient documentation

## 2011-03-15 ENCOUNTER — Emergency Department (HOSPITAL_COMMUNITY)
Admission: EM | Admit: 2011-03-15 | Discharge: 2011-03-15 | Disposition: A | Discharge status: Home / Self Care | Payer: Self-pay | Attending: Emergency Medicine | Admitting: Emergency Medicine

## 2011-03-15 ENCOUNTER — Encounter (HOSPITAL_COMMUNITY): Payer: Self-pay

## 2011-03-15 DIAGNOSIS — K047 Periapical abscess without sinus: Secondary | ICD-10-CM

## 2011-03-15 MED ORDER — BUPIVACAINE-EPINEPHRINE 0.5% -1:200000 IJ SOLN
5.0000 mL | Freq: Once | INTRAMUSCULAR | Status: AC
  Administered 2011-03-15: 25 mg

## 2011-03-15 MED ORDER — IBUPROFEN 800 MG PO TABS: 800.0000 mg | ORAL_TABLET | Freq: Three times a day (TID) | ORAL | Status: DC

## 2011-03-15 MED ORDER — HYDROCODONE-ACETAMINOPHEN 7.5-500 MG/15ML PO SOLN: 15.0000 mL | Freq: Four times a day (QID) | ORAL | Status: DC | PRN

## 2011-03-15 MED ORDER — CLINDAMYCIN HCL 150 MG PO CAPS: 150.0000 mg | ORAL_CAPSULE | Freq: Four times a day (QID) | ORAL | Status: AC

## 2011-03-15 NOTE — ED Notes (Signed)
In er for toothache pain, ran out of his pain meds friday

## 2011-03-15 NOTE — ED Provider Notes (Signed)
Medical screening examination/treatment/procedure(s) were performed by non-physician practitioner and as supervising physician I was immediately available for consultation/collaboration.  in and[facility]  Benny Lennert, MD 03/15/11 [phone]

## 2011-03-15 NOTE — ED Notes (Signed)
Pt seen in triage by dr.opitz

## 2011-03-15 NOTE — ED Provider Notes (Signed)
History     CSN: [id number] Arrival date & time: 03/15/2011 12:05 AM  Chief Complaint  Patient presents with  . Dental Pain    HPI  (Consider location/radiation/quality/duration/timing/severity/associated sxs/prior treatment)  Patient is a 30 y.o. male presenting with tooth pain. The history is provided by the patient.  Dental PainPrimary symptoms do not include sore throat.  Additional symptoms do not include: facial swelling, drooling and nosebleeds.   onset a few months ago were worse the last few days since he ran out of his oxycodone at home. States has appointment with his dentist on the 28th of this month. He is requesting narcotic pain medications as he states that the only thing that helps. And that ibuprofen upsets his stomach. No facial swelling, or difficulty opening his mouth. No difficulty breathing or swallowing. No nausea or vomiting. States it hurts to chew up put any pressure on his right upper tooth. Duration timing is constant severity is moderate to severe.  History reviewed. No pertinent past medical history.  History reviewed. No pertinent past surgical history.  No family history on file.  History  Substance Use Topics  . Smoking status: Current Some Day Smoker -- 0.5 packs/day    Types: Cigarettes  . Smokeless tobacco: Not on file  . Alcohol Use: No      Review of Systems  Review of Systems  HENT: Negative for nosebleeds, sore throat, facial swelling, drooling, mouth sores and voice change.   Musculoskeletal: Negative for joint swelling.    Allergies  Acetaminophen; Darvocet; Hydrocodone; Toradol; and Tramadol  Home Medications   Current Outpatient Rx  Name Route Sig Dispense Refill  . CLINDAMYCIN HCL 150 MG PO CAPS Oral Take 2 capsules (300 mg total) by mouth 3 (three) times daily. 60 capsule 0  . NAPROXEN 500 MG PO TABS Oral Take 1 tablet (500 mg total) by mouth 2 (two) times daily. 30 tablet 0  . OXYCODONE HCL 5 MG PO TABS Oral Take 1  tablet (5 mg total) by mouth every 6 (six) hours as needed for pain. 20 tablet 0  . OXYCODONE HCL 10 MG PO TABS Oral Take 1 tablet (10 mg total) by mouth 2 (two) times daily. 20 each 0  . OXYCODONE HCL 10 MG PO TABS Oral Take 1 tablet (10 mg total) by mouth 3 (three) times daily after meals. 25 each 0  . PENICILLIN V POTASSIUM 500 MG PO TABS Oral Take 1 tablet (500 mg total) by mouth 3 (three) times daily. 30 tablet 0    Physical Exam    BP 126/66  Pulse 98  Temp 99.1 F (37.3 C)  Resp 20  Ht 5\' 9"  (1.753 m)  Wt 160 lb (72.576 kg)  BMI 23.63 kg/m2  SpO2 100%  Physical Exam  Constitutional: He is oriented to person, place, and time. He appears well-developed and well-nourished.  HENT:  Head: Normocephalic and atraumatic.       Very poor dentition. Right upper first molar tender to palpate with caries present. There is no associated gingival swelling. No trismus.no  Associated facial swelling or fluctuance. No erythema. No lymphadenopathy.  Eyes: Conjunctivae and EOM are normal. Pupils are equal, round, and reactive to light.  Neck: Trachea normal. Neck supple. No thyromegaly present.  Cardiovascular: Normal rate, regular rhythm, S1 normal, S2 normal and normal pulses.     No systolic murmur is present   No diastolic murmur is present  Pulses:      Radial pulses are 2+  on the right side, and 2+ on the left side.  Pulmonary/Chest: Effort normal and breath sounds normal. He has no wheezes. He has no rhonchi. He has no rales. He exhibits no tenderness.  Abdominal: Soft. Normal appearance and bowel sounds are normal. There is no tenderness. There is no CVA tenderness and negative Murphy's sign.  Neurological: He is alert and oriented to person, place, and time. He has normal strength. No cranial nerve deficit or sensory deficit. GCS eye subscore is 4. GCS verbal subscore is 5. GCS motor subscore is 6.  Skin: Skin is warm and dry. No rash noted. He is not diaphoretic.  Psychiatric: His  speech is normal.       Cooperative and appropriate    ED Course  Dental Date/Time: 03/15/2011 12:17 AM Performed by: Sunnie Nielsen Authorized by: Sunnie Nielsen Consent: Verbal consent obtained. Risks and benefits: risks, benefits and alternatives were discussed Consent given by: patient Patient understanding: patient states understanding of the procedure being performed Patient consent: the patient's understanding of the procedure matches consent given Procedure consent: procedure consent matches procedure scheduled Patient identity confirmed: verbally with patient Time out: Immediately prior to procedure a "time out" was called to verify the correct patient, procedure, equipment, support staff and site/side marked as required. Local anesthesia used: yes Anesthesia: local infiltration Local anesthetic: bupivacaine 0.5% with epinephrine Anesthetic total: 2 ml Patient tolerance: Patient tolerated the procedure well with no immediate complications. Comments: Right upper first molar block. Pain improved and patient tolerated well. No complications   (including critical care time)  Labs Reviewed - No data to display No results found.   No diagnosis found.   MDM Dental pain with history of the same as exam consistent with abscess. Dental block performed as above with pain improved. Records reviewed patient has received a significant amount of narcotic pain medications. He states is the thing that works. He is given a prescription for antibiotics, minimal amount of narcotic pain medications to take at home, ibuprofen with recommendations to take with food, and patient agrees to keep his scheduled followup with his dentist on the 28th her dental cleaning and extraction of this tooth.        Sunnie Nielsen, MD 03/15/11 [phone]

## 2011-03-15 NOTE — ED Notes (Signed)
Pt initially refused to have tooth numbed stated he wanted pain meds instead, pt agreed to numbing meds after long discussion w. md

## 2011-03-15 NOTE — ED Notes (Signed)
Toothache top right x 1 month

## 2011-03-21 ENCOUNTER — Encounter (HOSPITAL_COMMUNITY): Payer: Self-pay | Admitting: *Deleted

## 2011-03-21 ENCOUNTER — Emergency Department (HOSPITAL_COMMUNITY)
Admission: EM | Admit: 2011-03-21 | Discharge: 2011-03-21 | Discharge status: Against Medical Advice | Payer: Self-pay | Attending: *Deleted | Admitting: *Deleted

## 2011-03-21 DIAGNOSIS — K089 Disorder of teeth and supporting structures, unspecified: Secondary | ICD-10-CM | POA: Insufficient documentation

## 2011-03-21 NOTE — ED Notes (Signed)
Went in room to see pt and pt not in room

## 2011-03-21 NOTE — ED Notes (Signed)
Went in room to see pt and pt not in room 

## 2011-03-21 NOTE — ED Notes (Signed)
Pt c/o pain to tooth on the top right side of mouth.

## 2011-03-23 NOTE — ED Provider Notes (Signed)
Medical screening examination/treatment/procedure(s) were performed by non-physician practitioner and as supervising physician I was immediately available for consultation/collaboration.  Nicoletta Dress. Colon Branch, MD 03/23/11 1610

## 2011-03-25 ENCOUNTER — Encounter (HOSPITAL_COMMUNITY): Payer: Self-pay | Admitting: *Deleted

## 2011-03-25 ENCOUNTER — Emergency Department (HOSPITAL_COMMUNITY)
Admission: EM | Admit: 2011-03-25 | Discharge: 2011-03-26 | Disposition: A | Discharge status: Home / Self Care | Payer: Self-pay | Attending: Emergency Medicine | Admitting: Emergency Medicine

## 2011-03-25 DIAGNOSIS — K047 Periapical abscess without sinus: Secondary | ICD-10-CM | POA: Insufficient documentation

## 2011-03-25 DIAGNOSIS — K0889 Other specified disorders of teeth and supporting structures: Secondary | ICD-10-CM

## 2011-03-25 DIAGNOSIS — K089 Disorder of teeth and supporting structures, unspecified: Secondary | ICD-10-CM | POA: Insufficient documentation

## 2011-03-25 MED ORDER — OXYCODONE-ACETAMINOPHEN 5-325 MG PO TABS
1.0000 | ORAL_TABLET | Freq: Once | ORAL | Status: AC
  Administered 2011-03-26: 1 via ORAL
  Filled 2011-03-25: qty 1

## 2011-03-25 MED ORDER — OXYCODONE-ACETAMINOPHEN 5-325 MG PO TABS: 1.0000 | ORAL_TABLET | ORAL | Status: AC | PRN

## 2011-03-25 NOTE — ED Provider Notes (Signed)
History     CSN: [id number] Arrival date & time: 03/25/2011 10:54 PM  Chief Complaint  Patient presents with  . Dental Pain    (Consider location/radiation/quality/duration/timing/severity/associated sxs/prior treatment) Patient is a 30 y.o. male presenting with tooth pain. The history is provided by the patient.  Dental PainThe primary symptoms include mouth pain. Primary symptoms do not include oral bleeding, oral lesions, headaches, fever, shortness of breath, sore throat, angioedema or cough. The symptoms began more than 1 week ago. The symptoms are worsening. The symptoms are chronic. The symptoms occur constantly.  Mouth pain occurs constantly. Mouth pain is worsening. Affected locations include: teeth.  Additional symptoms include: dental sensitivity to temperature, gum swelling and gum tenderness. Additional symptoms do not include: purulent gums, trismus, jaw pain, facial swelling, trouble swallowing, pain with swallowing, ear pain and fatigue. Medical issues include: smoking and periodontal disease.    History reviewed. No pertinent past medical history.  History reviewed. No pertinent past surgical history.  History reviewed. No pertinent family history.  History  Substance Use Topics  . Smoking status: Current Some Day Smoker -- 0.5 packs/day    Types: Cigarettes  . Smokeless tobacco: Not on file  . Alcohol Use: No      Review of Systems  Constitutional: Negative for fever, chills and fatigue.  HENT: Positive for dental problem. Negative for ear pain, sore throat, facial swelling, rhinorrhea, sneezing, trouble swallowing, neck pain and neck stiffness.   Respiratory: Negative for cough, shortness of breath and wheezing.   Cardiovascular: Negative for chest pain and palpitations.  Gastrointestinal: Negative for nausea, vomiting, abdominal pain and blood in stool.  Genitourinary: Negative for dysuria, hematuria and flank pain.  Musculoskeletal: Negative for myalgias,  back pain and arthralgias.  Skin: Negative for rash.  Neurological: Negative for dizziness, weakness, numbness and headaches.  Hematological: Negative for adenopathy. Does not bruise/bleed easily.  All other systems reviewed and are negative.    Allergies  Acetaminophen; Darvocet; Hydrocodone; Toradol; and Tramadol  Home Medications   Current Outpatient Rx  Name Route Sig Dispense Refill  . CLINDAMYCIN HCL 150 MG PO CAPS Oral Take 1 capsule (150 mg total) by mouth every 6 (six) hours. 28 capsule 0    BP 153/86  Pulse 101  Temp(Src) 98.8 F (37.1 C) (Oral)  Resp 12  SpO2 100%  Physical Exam  Nursing note and vitals reviewed. Constitutional: He is oriented to person, place, and time. He appears well-developed and well-nourished. No distress.  HENT:  Head: Normocephalic and atraumatic. No trismus in the jaw.  Mouth/Throat: Uvula is midline, oropharynx is clear and moist and mucous membranes are normal. Abnormal dentition. Dental caries present. No dental abscesses or uvula swelling.    Neck: Normal range of motion. Neck supple.  Cardiovascular: Normal rate, regular rhythm and normal heart sounds.   Pulmonary/Chest: Effort normal and breath sounds normal. No respiratory distress. He exhibits no tenderness.  Abdominal: Soft. He exhibits no distension. There is no tenderness.  Musculoskeletal: Normal range of motion. He exhibits no tenderness.  Lymphadenopathy:    He has no cervical adenopathy.  Neurological: He is alert and oriented to person, place, and time. No cranial nerve deficit. He exhibits normal muscle tone. Coordination normal.  Skin: Skin is warm and dry.    ED Course  Procedures (including critical care time)       MDM   11:21 PM  widespread dental decay and discoloration of the remaining teeth.  ttp of the right upper gums with  mild swelling surrounding the right upper second premolar and first molar. No obvious dental abscess at this time.    Patient  has been seen here multiple times recently for dental pain and states he doesn't have money to f/u with a dentist.  I have discussed with patient that he will need further f/u with a dentist, that ER cannot manage his chronic dental pain.  Patient verbalized understanding.        Ayanni Tun L. Spencer, [place] 03/25/11 2332

## 2011-03-25 NOTE — ED Notes (Signed)
Pt reports increased pain to top rt tooth, pt reports tooth has been chipped for a while and pain is worse tonight

## 2011-03-28 NOTE — ED Provider Notes (Signed)
Medical screening examination/treatment/procedure(s) were performed by non-physician practitioner and as supervising physician I was immediately available for consultation/collaboration.  Sissy Goetzke S. Marlow Berenguer, MD 03/28/11 2103 

## 2011-03-31 ENCOUNTER — Encounter (HOSPITAL_COMMUNITY): Payer: Self-pay | Admitting: *Deleted

## 2011-03-31 ENCOUNTER — Emergency Department (HOSPITAL_COMMUNITY)
Admission: EM | Admit: 2011-03-31 | Discharge: 2011-03-31 | Disposition: A | Discharge status: Home / Self Care | Payer: Self-pay | Attending: Emergency Medicine | Admitting: Emergency Medicine

## 2011-03-31 DIAGNOSIS — F17200 Nicotine dependence, unspecified, uncomplicated: Secondary | ICD-10-CM | POA: Insufficient documentation

## 2011-03-31 DIAGNOSIS — K0381 Cracked tooth: Secondary | ICD-10-CM | POA: Insufficient documentation

## 2011-03-31 DIAGNOSIS — K029 Dental caries, unspecified: Secondary | ICD-10-CM | POA: Insufficient documentation

## 2011-03-31 DIAGNOSIS — K089 Disorder of teeth and supporting structures, unspecified: Secondary | ICD-10-CM | POA: Insufficient documentation

## 2011-03-31 DIAGNOSIS — K0889 Other specified disorders of teeth and supporting structures: Secondary | ICD-10-CM

## 2011-03-31 MED ORDER — OXYCODONE HCL 5 MG PO TABS
10.0000 mg | ORAL_TABLET | Freq: Once | ORAL | Status: AC
  Administered 2011-03-31: 10 mg via ORAL
  Filled 2011-03-31: qty 2

## 2011-03-31 NOTE — ED Notes (Signed)
Reports continued pain in right upper tooth.  Reports pain has increased tonight.

## 2011-03-31 NOTE — ED Notes (Signed)
Patient with dental pain for 2 months, c/o pain

## 2011-03-31 NOTE — ED Provider Notes (Addendum)
History     CSN: [id number] Arrival date & time: 03/31/2011  2:28 AM  Chief Complaint  Patient presents with  . Dental Pain    (Consider location/radiation/quality/duration/timing/severity/associated sxs/prior treatment) HPI Comments: Seen 0243. Patient with multiple visits to the ER for dental pain. He has not seen a dentist due to financial constraints. Last seen 03/25/11 and given #6 percocet.  Patient is a 30 y.o. male presenting with tooth pain. The history is provided by the patient.  Dental PainThe primary symptoms include mouth pain. Episode onset: patient has multiple back teeth. Unable to see dentist due to financial constraints. The symptoms are worsening. The symptoms are chronic. The symptoms occur constantly.  Additional symptoms include: dental sensitivity to temperature. Additional symptoms do not include: gum swelling and gum tenderness.    History reviewed. No pertinent past medical history.  History reviewed. No pertinent past surgical history.  History reviewed. No pertinent family history.  History  Substance Use Topics  . Smoking status: Current Some Day Smoker -- 0.5 packs/day    Types: Cigarettes  . Smokeless tobacco: Not on file  . Alcohol Use: No      Review of Systems  HENT:       Tooth pain  All other systems reviewed and are negative.    Allergies  Acetaminophen; Darvocet; Hydrocodone; Toradol; and Tramadol  Home Medications   Current Outpatient Rx  Name Route Sig Dispense Refill  . OXYCODONE-ACETAMINOPHEN 5-325 MG PO TABS Oral Take 1 tablet by mouth every 4 (four) hours as needed for pain. 6 tablet 0    BP 134/76  Pulse 90  Temp(Src) 98.5 F (36.9 C) (Oral)  Resp 16  Ht 5\' 9"  (1.753 m)  Wt 160 lb (72.576 kg)  BMI 23.63 kg/m2  SpO2 100%  Physical Exam  Nursing note and vitals reviewed. Constitutional: He is oriented to person, place, and time. He appears well-developed and well-nourished.  HENT:  Head: Normocephalic and  atraumatic.       Multiple broken teeth, dental caries. No swollen gums. ttt upper right teeth  Eyes: EOM are normal.  Neck: Normal range of motion.  Cardiovascular: Normal rate, normal heart sounds and intact distal pulses.   Pulmonary/Chest: Effort normal and breath sounds normal.  Musculoskeletal: Normal range of motion.  Lymphadenopathy:    He has no cervical adenopathy.  Neurological: He is alert and oriented to person, place, and time.  Skin: Skin is warm and dry.    ED Course  Procedures (including critical care time)  Patient with dental pain. Again reinforced that ER is not management for chronic pain. Given dental referral information again. MDM Reviewed: nursing note and vitals          Nicoletta Dress. Colon Branch, MD 03/31/11 Donnal Debar  Nicoletta Dress. Colon Branch, MD 03/31/11 [id number]

## 2011-03-31 NOTE — ED Notes (Signed)
Toothache for months 

## 2011-04-01 ENCOUNTER — Encounter (HOSPITAL_COMMUNITY): Payer: Self-pay | Admitting: *Deleted

## 2011-04-01 ENCOUNTER — Emergency Department (HOSPITAL_COMMUNITY)
Admission: EM | Admit: 2011-04-01 | Discharge: 2011-04-01 | Disposition: A | Discharge status: Home / Self Care | Payer: Self-pay | Attending: Emergency Medicine | Admitting: Emergency Medicine

## 2011-04-01 DIAGNOSIS — K0381 Cracked tooth: Secondary | ICD-10-CM | POA: Insufficient documentation

## 2011-04-01 DIAGNOSIS — K7460 Unspecified cirrhosis of liver: Secondary | ICD-10-CM | POA: Insufficient documentation

## 2011-04-01 DIAGNOSIS — F17200 Nicotine dependence, unspecified, uncomplicated: Secondary | ICD-10-CM | POA: Insufficient documentation

## 2011-04-01 DIAGNOSIS — K029 Dental caries, unspecified: Secondary | ICD-10-CM | POA: Insufficient documentation

## 2011-04-01 HISTORY — DX: Unspecified cirrhosis of liver: K74.60

## 2011-04-01 MED ORDER — OXYCODONE HCL 5 MG PO TABS
10.0000 mg | ORAL_TABLET | Freq: Once | ORAL | Status: AC
  Administered 2011-04-01: 10 mg via ORAL
  Filled 2011-04-01: qty 2

## 2011-04-01 MED ORDER — OXYCODONE HCL 5 MG PO TABS
5.0000 mg | ORAL_TABLET | Freq: Once | ORAL | Status: AC
  Administered 2011-04-01: 5 mg via ORAL
  Filled 2011-04-01: qty 1

## 2011-04-01 NOTE — ED Notes (Signed)
Upper rt toothache for 2 mos

## 2011-04-01 NOTE — ED Notes (Signed)
Pt states has had tooth ache x 2 months off and on, pain has worsened over past several days.  Pt states has dental appt next Friday with family member to pay the bill. Pt has no swelling at site, however tooth is broken with a dental carrie.

## 2011-04-01 NOTE — ED Provider Notes (Signed)
History     CSN: [id number] Arrival date & time: 04/01/2011  8:08 PM  Chief Complaint  Patient presents with  . Dental Pain    (Consider location/radiation/quality/duration/timing/severity/associated sxs/prior treatment) Patient is a 30 y.o. [name] presenting with tooth pain. The history is provided by the patient.  Dental PainThe primary symptoms include mouth pain. Primary symptoms do not include headaches, fever, shortness of breath or sore throat. Primary symptoms comment: Patient presents with poor dentition,  with complaint of increased chronic pain in his right upper 1st molar tooth.  He is scheduled to see a dentist  next week. The symptoms began more than 1 month ago. The symptoms are worsening. The symptoms are chronic. The symptoms occur constantly.  Additional symptoms include: gum tenderness. Additional symptoms do not include: gum swelling and purulent gums.    Past Medical History  Diagnosis Date  . Cirrhosis     History reviewed. No pertinent past surgical history.  History reviewed. No pertinent family history.  History  Substance Use Topics  . Smoking status: Current Some Day Smoker -- 0.5 packs/day    Types: Cigarettes  . Smokeless tobacco: Not on file  . Alcohol Use: No      Review of Systems  Constitutional: Negative for fever.  HENT: Positive for dental problem. Negative for congestion, sore throat and neck pain.   Eyes: Negative.   Respiratory: Negative for chest tightness and shortness of breath.   Cardiovascular: Negative for chest pain.  Gastrointestinal: Negative for nausea and abdominal pain.  Genitourinary: Negative.   Musculoskeletal: Negative for joint swelling and arthralgias.  Skin: Negative.  Negative for rash and wound.  Neurological: Negative for dizziness, weakness, light-headedness, numbness and headaches.  Hematological: Negative.   Psychiatric/Behavioral: Negative.     Allergies  Acetaminophen; Darvocet; Hydrocodone; Toradol;  and Tramadol  Home Medications   Current Outpatient Rx  Name Route Sig Dispense Refill  . CLINDAMYCIN HCL 150 MG PO CAPS Oral Take 150 mg by mouth 3 (three) times daily.      . OXYCODONE-ACETAMINOPHEN 5-325 MG PO TABS Oral Take 1 tablet by mouth every 4 (four) hours as needed for pain. 6 tablet 0    BP 135/83  Pulse 93  Temp(Src) 99.2 F (37.3 C) (Oral)  SpO2 100%  Physical Exam  Constitutional: He is oriented to person, place, and time. He appears well-developed and well-nourished. No distress.  HENT:  Head: Normocephalic and atraumatic.  Right Ear: Tympanic membrane and external ear normal.  Left Ear: Tympanic membrane and external ear normal.  Mouth/Throat: Oropharynx is clear and moist and mucous membranes are normal. No oral lesions. Dental caries present. No dental abscesses.    Eyes: Conjunctivae are normal.  Neck: Normal range of motion. Neck supple.  Cardiovascular: Normal rate and normal heart sounds.   Pulmonary/Chest: Effort normal.  Abdominal: He exhibits no distension.  Musculoskeletal: Normal range of motion.  Lymphadenopathy:    He has no cervical adenopathy.  Neurological: He is alert and oriented to person, place, and time.  Skin: Skin is warm and dry. No erythema.  Psychiatric: He has a normal mood and affect.    ED Course  Procedures (including critical care time)  Patient given one oxycodone tablet and stressed that the ed cannot continue to treat this chronic pain,  He needs to see a dentist for this problem.  1. Dental decay       MDM  Multiple recent visits for dental pain with narcotic prescriptions obtained.   Has  had 11 ed visits in past 2 months.  Most recently seen here last night.   Patient appears stable,  And is currently taking clindamycin from last visit here on 03/25/11.           Roy Musa, PA 04/01/11 2109

## 2011-04-01 NOTE — ED Provider Notes (Signed)
Medical screening examination/treatment/procedure(s) were performed by non-physician practitioner and as supervising physician I was immediately available for consultation/collaboration.  Nicholes Stairs, MD 04/01/11 [phone]

## 2011-04-01 NOTE — ED Provider Notes (Signed)
History     CSN: [id number] Arrival date & time: 04/01/2011 12:15 AM  Chief Complaint  Patient presents with  . Dental Pain    (Consider location/radiation/quality/duration/timing/severity/associated sxs/prior treatment) Patient is a 30 y.o. male presenting with tooth pain. The history is provided by the patient.  Dental PainThe primary symptoms include mouth pain. Primary symptoms do not include shortness of breath or cough. The symptoms began more than 1 month ago. The symptoms are unchanged. The symptoms occur constantly.  Additional symptoms include: dental sensitivity to temperature, gum swelling, gum tenderness and jaw pain. Additional symptoms do not include: nosebleeds. Medical issues include: smoking. Medical issues do not include: cancer.    History reviewed. No pertinent past medical history.  History reviewed. No pertinent past surgical history.  History reviewed. No pertinent family history.  History  Substance Use Topics  . Smoking status: Current Some Day Smoker -- 0.5 packs/day    Types: Cigarettes  . Smokeless tobacco: Not on file  . Alcohol Use: No      Review of Systems  Constitutional: Negative for activity change.       All ROS Neg except as noted in HPI  HENT: Positive for dental problem. Negative for nosebleeds and neck pain.   Eyes: Negative for photophobia and discharge.  Respiratory: Negative for cough, shortness of breath and wheezing.   Cardiovascular: Negative for chest pain and palpitations.  Gastrointestinal: Negative for abdominal pain and blood in stool.  Genitourinary: Negative for dysuria, frequency and hematuria.  Musculoskeletal: Negative for back pain and arthralgias.  Skin: Negative.   Neurological: Negative for dizziness, seizures and speech difficulty.  Psychiatric/Behavioral: Negative for hallucinations and confusion.    Allergies  Acetaminophen; Darvocet; Hydrocodone; Toradol; and Tramadol  Home Medications   Current  Outpatient Rx  Name Route Sig Dispense Refill  . OXYCODONE-ACETAMINOPHEN 5-325 MG PO TABS Oral Take 1 tablet by mouth every 4 (four) hours as needed for pain. 6 tablet 0    BP 145/77  Pulse 82  Temp(Src) 98.7 F (37.1 C) (Oral)  Resp 16  Ht 5\' 9"  (1.753 m)  Wt 160 lb (72.576 kg)  BMI 23.63 kg/m2  SpO2 100%  Physical Exam  Nursing note and vitals reviewed. Constitutional: He is oriented to person, place, and time. He appears well-developed and well-nourished.  Non-toxic appearance.  HENT:  Head: Normocephalic.  Right Ear: Tympanic membrane and external ear normal.  Left Ear: Tympanic membrane and external ear normal.       Multiple dental caries. No visible abscess.  Eyes: EOM and lids are normal. Pupils are equal, round, and reactive to light.  Neck: Normal range of motion. Neck supple. Carotid bruit is not present.  Cardiovascular: Normal rate, regular rhythm, normal heart sounds, intact distal pulses and normal pulses.   Pulmonary/Chest: Breath sounds normal. No respiratory distress.  Abdominal: Soft. Bowel sounds are normal. There is no tenderness. There is no guarding.  Musculoskeletal: Normal range of motion.  Lymphadenopathy:       Head (right side): No submandibular adenopathy present.       Head (left side): No submandibular adenopathy present.    He has no cervical adenopathy.  Neurological: He is alert and oriented to person, place, and time. He has normal strength. No cranial nerve deficit or sensory deficit.  Skin: Skin is warm and dry.  Psychiatric: He has a normal mood and affect. His speech is normal.    ED Course: Pt was advised that he must see a dentist  for management of his dental problem.  Procedures (including critical care time)  Labs Reviewed - No data to display No results found.   1. Dental caries       MDM  I have reviewed nursing notes, vital signs, and all appropriate lab and imaging results for this patient.        Kathie Dike, [place] 04/01/11 [phone]

## 2011-04-04 ENCOUNTER — Emergency Department (HOSPITAL_COMMUNITY)
Admission: EM | Admit: 2011-04-04 | Discharge: 2011-04-04 | Disposition: A | Discharge status: Home / Self Care | Payer: Self-pay | Attending: Emergency Medicine | Admitting: Emergency Medicine

## 2011-04-04 ENCOUNTER — Encounter (HOSPITAL_COMMUNITY): Payer: Self-pay | Admitting: *Deleted

## 2011-04-04 DIAGNOSIS — K089 Disorder of teeth and supporting structures, unspecified: Secondary | ICD-10-CM | POA: Insufficient documentation

## 2011-04-04 DIAGNOSIS — F17200 Nicotine dependence, unspecified, uncomplicated: Secondary | ICD-10-CM | POA: Insufficient documentation

## 2011-04-04 DIAGNOSIS — K0381 Cracked tooth: Secondary | ICD-10-CM | POA: Insufficient documentation

## 2011-04-04 DIAGNOSIS — K0889 Other specified disorders of teeth and supporting structures: Secondary | ICD-10-CM

## 2011-04-04 DIAGNOSIS — K029 Dental caries, unspecified: Secondary | ICD-10-CM | POA: Insufficient documentation

## 2011-04-04 MED ORDER — OXYCODONE HCL 5 MG PO TABS
10.0000 mg | ORAL_TABLET | Freq: Once | ORAL | Status: AC
  Administered 2011-04-04: 10 mg via ORAL
  Filled 2011-04-04: qty 2

## 2011-04-04 NOTE — ED Provider Notes (Signed)
History     CSN: [id number] Arrival date & time: 04/04/2011  1:18 AM  Chief Complaint  Patient presents with  . Dental Pain    (Consider location/radiation/quality/duration/timing/severity/associated sxs/prior treatment) HPI Comments: Seen 27. Patient with multiple dental caries who has been to the ER  Almost daily for pain medicine He has not sought care with a dentist despite having been given information and referral data. He is currently still taking clindamycin for a previus visit. He has made no attempt to use OTC medicines for pain. Last few visits has received single does analgesic in the ER without Rx. He is here seeking a Rx .   Past Medical History  Diagnosis Date  . Cirrhosis     History reviewed. No pertinent past surgical history.  No family history on file.  History  Substance Use Topics  . Smoking status: Current Some Day Smoker -- 0.5 packs/day    Types: Cigarettes  . Smokeless tobacco: Not on file  . Alcohol Use: No      Review of Systems  HENT:       Dental pain  All other systems reviewed and are negative.    Allergies  Acetaminophen; Darvocet; Hydrocodone; Toradol; and Tramadol  Home Medications   Current Outpatient Rx  Name Route Sig Dispense Refill  . CLINDAMYCIN HCL 150 MG PO CAPS Oral Take 150 mg by mouth 3 (three) times daily.      . OXYCODONE-ACETAMINOPHEN 5-325 MG PO TABS Oral Take 1 tablet by mouth every 4 (four) hours as needed for pain. 6 tablet 0    BP 132/64  Pulse 77  Temp(Src) 99 F (37.2 C) (Oral)  Resp 18  Ht 5\' 9"  (1.753 m)  Wt 160 lb (72.576 kg)  BMI 23.63 kg/m2  SpO2 100%  Physical Exam  Vitals reviewed. Constitutional: He is oriented to person, place, and time. He appears well-developed and well-nourished. No distress.  HENT:  Mouth/Throat: Oropharynx is clear and moist.       Many dental caries, poor dental hygiene, many dental fractures.   Eyes: EOM are normal.  Neck: Normal range of motion.    Cardiovascular: Normal rate, normal heart sounds and intact distal pulses.   Pulmonary/Chest: Effort normal and breath sounds normal.  Musculoskeletal: Normal range of motion.  Neurological: He is alert and oriented to person, place, and time.  Skin: Skin is warm.    ED Course  Procedures (including critical care time)    MDM  Patient with poor dental health, here for pain. Multiple previous visits for the same. No active attempt on his part to obtain care with a dentist.Will provide single dose analgesic here in the ER as we have been doing for several visits. Again provided resource information.Pt stable in ED with no significant deterioration in condition. MDM Reviewed: previous chart, nursing note and vitals           Nicoletta Dress. Colon Branch, MD 04/04/11 [phone]

## 2011-04-04 NOTE — ED Notes (Signed)
Pt reports dental pain worsening tonight

## 2011-04-07 ENCOUNTER — Emergency Department (HOSPITAL_COMMUNITY)
Admission: EM | Admit: 2011-04-07 | Discharge: 2011-04-07 | Disposition: A | Discharge status: Home / Self Care | Payer: Self-pay | Attending: Emergency Medicine | Admitting: Emergency Medicine

## 2011-04-07 DIAGNOSIS — K089 Disorder of teeth and supporting structures, unspecified: Secondary | ICD-10-CM | POA: Insufficient documentation

## 2011-04-07 DIAGNOSIS — K0889 Other specified disorders of teeth and supporting structures: Secondary | ICD-10-CM

## 2011-04-13 ENCOUNTER — Emergency Department (HOSPITAL_COMMUNITY): Payer: Self-pay

## 2011-04-13 ENCOUNTER — Emergency Department (HOSPITAL_COMMUNITY)
Admission: EM | Admit: 2011-04-13 | Discharge: 2011-04-13 | Disposition: A | Discharge status: Home / Self Care | Payer: Self-pay | Attending: Emergency Medicine | Admitting: Emergency Medicine

## 2011-04-13 ENCOUNTER — Encounter (HOSPITAL_COMMUNITY): Payer: Self-pay | Admitting: *Deleted

## 2011-04-13 DIAGNOSIS — IMO0002 Reserved for concepts with insufficient information to code with codable children: Secondary | ICD-10-CM | POA: Insufficient documentation

## 2011-04-13 DIAGNOSIS — Y92410 Unspecified street and highway as the place of occurrence of the external cause: Secondary | ICD-10-CM | POA: Insufficient documentation

## 2011-04-13 DIAGNOSIS — K7460 Unspecified cirrhosis of liver: Secondary | ICD-10-CM | POA: Insufficient documentation

## 2011-04-13 DIAGNOSIS — M549 Dorsalgia, unspecified: Secondary | ICD-10-CM | POA: Insufficient documentation

## 2011-04-13 DIAGNOSIS — F29 Unspecified psychosis not due to a substance or known physiological condition: Secondary | ICD-10-CM | POA: Insufficient documentation

## 2011-04-13 DIAGNOSIS — S82143A Displaced bicondylar fracture of unspecified tibia, initial encounter for closed fracture: Secondary | ICD-10-CM

## 2011-04-13 DIAGNOSIS — F17200 Nicotine dependence, unspecified, uncomplicated: Secondary | ICD-10-CM | POA: Insufficient documentation

## 2011-04-13 DIAGNOSIS — S82109A Unspecified fracture of upper end of unspecified tibia, initial encounter for closed fracture: Secondary | ICD-10-CM | POA: Insufficient documentation

## 2011-04-13 LAB — BASIC METABOLIC PANEL
BUN: 9 mg/dL (ref 6–23)
Chloride: 100 mEq/L (ref 96–112)
GFR calc Af Amer: >90 mL/min (ref >90)
Glucose, Bld: 107 mg/dL — ABNORMAL HIGH (ref 70–99)
Potassium: 3.6 mEq/L (ref 3.5–5.1)

## 2011-04-13 LAB — CBC
HCT: 40.5 % (ref 39.0–52.0)
Hemoglobin: 13.5 g/dL (ref 13.0–17.0)
RDW: 12.8 % (ref 11.5–15.5)
WBC: 8.5 10*3/uL (ref 4.0–10.5)

## 2011-04-13 MED ORDER — HYDROMORPHONE HCL 2 MG/ML IJ SOLN
2.0000 mg | Freq: Once | INTRAMUSCULAR | Status: AC
  Administered 2011-04-13: 2 mg via INTRAMUSCULAR
  Filled 2011-04-13: qty 1

## 2011-04-13 MED ORDER — OXYCODONE HCL 5 MG PO TABS: 5.0000 mg | ORAL_TABLET | ORAL | Status: AC | PRN

## 2011-04-13 MED ORDER — OXYCODONE HCL 5 MG PO TABS
5.0000 mg | ORAL_TABLET | Freq: Once | ORAL | Status: AC
  Administered 2011-04-13: 5 mg via ORAL

## 2011-04-13 MED ORDER — OXYCODONE-ACETAMINOPHEN 5-325 MG PO TABS: 1.0000 | ORAL_TABLET | Freq: Four times a day (QID) | ORAL | Status: DC | PRN

## 2011-04-13 MED ORDER — OXYCODONE HCL 5 MG PO TABS
ORAL_TABLET | ORAL | Status: AC
  Administered 2011-04-13: 5 mg via ORAL
  Filled 2011-04-13: qty 1

## 2011-04-13 NOTE — ED Notes (Signed)
Rx given x1 D/c instructions reviewed w/ pt and family - pt and family deny any further questions or concerns at present.  
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
## 2011-04-13 NOTE — ED Notes (Signed)
Pt requesting to know when he is going to get discharged. Pt informed awaiting paperwork from edp.

## 2011-04-13 NOTE — ED Notes (Signed)
EDP at bedside to examine; removed from LSB; C-Collar remains in place.
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
## 2011-04-17 NOTE — ED Provider Notes (Signed)
History     CSN: [id number] Arrival date & time: 04/07/2011  1:41 AM   None     No chief complaint on file.   (Consider location/radiation/quality/duration/timing/severity/associated sxs/prior treatment) HPI Comments: Seen 35. Patient with multiple visits to the ER for dental pain. He has been given many narcotic Rx for said pain and has not followed up with a dentist or dental clinics to which he has been referred. I explained I would not be giving him a narcotic for his pain. He left the room.  Patient is a 30 y.o. male presenting with tooth pain. The history is provided by the patient.  Dental PainPrimary symptoms comment: Patient with poor dental hygience and multiple cavities and broken teeth. The symptoms are unchanged. The symptoms are chronic.  Additional symptoms include: dental sensitivity to temperature, gum tenderness and trouble swallowing. Additional symptoms do not include: trismus.    Past Medical History  Diagnosis Date  . Cirrhosis     No past surgical history on file.  No family history on file.  History  Substance Use Topics  . Smoking status: Current Some Day Smoker -- 0.5 packs/day    Types: Cigarettes  . Smokeless tobacco: Not on file  . Alcohol Use: No      Review of Systems  HENT: Positive for trouble swallowing and dental problem.   All other systems reviewed and are negative.    Allergies  Acetaminophen; Darvocet; Hydrocodone; Toradol; and Tramadol  Home Medications   Current Outpatient Rx  Name Route Sig Dispense Refill  . CLINDAMYCIN HCL 150 MG PO CAPS Oral Take 150 mg by mouth 3 (three) times daily.      . OXYCODONE HCL 5 MG PO TABS Oral Take 1 tablet (5 mg total) by mouth every 4 (four) hours as needed for pain. 30 tablet 0    There were no vitals taken for this visit.  Physical Exam  Nursing note and vitals reviewed. Constitutional: He is oriented to person, place, and time. He appears well-developed and well-nourished.    Neck: Neck supple.  Pulmonary/Chest: Effort normal.  Musculoskeletal: Normal range of motion.  Neurological: He is alert and oriented to person, place, and time.    ED Course  Procedures (including critical care time)    MDM  Patient with multiple visits to the ER for dental pain. He left prior to being examined thoroughly. He refused to sign AMA forms. MDM Reviewed: nursing note, previous chart and vitals          Nicoletta Dress. Colon Branch, MD 04/17/11 1349

## 2011-04-18 ENCOUNTER — Emergency Department (HOSPITAL_COMMUNITY)
Admission: EM | Admit: 2011-04-18 | Discharge: 2011-04-18 | Discharge status: Against Medical Advice | Payer: Self-pay | Attending: Emergency Medicine | Admitting: Emergency Medicine

## 2011-04-18 ENCOUNTER — Encounter (HOSPITAL_COMMUNITY): Payer: Self-pay

## 2011-04-18 DIAGNOSIS — M25569 Pain in unspecified knee: Secondary | ICD-10-CM | POA: Insufficient documentation

## 2011-04-18 DIAGNOSIS — Z5320 Procedure and treatment not carried out because of patient's decision for unspecified reasons: Secondary | ICD-10-CM | POA: Insufficient documentation

## 2011-04-18 NOTE — ED Provider Notes (Signed)
Medical screening examination/treatment/procedure(s) were performed by non-physician practitioner and as supervising physician I was immediately available for consultation/collaboration.  Nicoletta Dress. Colon Branch, MD 04/18/11 [phone]
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
## 2011-04-24 ENCOUNTER — Encounter (HOSPITAL_COMMUNITY): Payer: Self-pay

## 2011-04-24 ENCOUNTER — Emergency Department (HOSPITAL_COMMUNITY)
Admission: EM | Admit: 2011-04-24 | Discharge: 2011-04-24 | Disposition: A | Discharge status: Home / Self Care | Payer: Self-pay | Attending: Emergency Medicine | Admitting: Emergency Medicine

## 2011-04-24 ENCOUNTER — Emergency Department (HOSPITAL_COMMUNITY): Payer: Self-pay

## 2011-04-24 DIAGNOSIS — F17200 Nicotine dependence, unspecified, uncomplicated: Secondary | ICD-10-CM | POA: Insufficient documentation

## 2011-04-24 DIAGNOSIS — S72413A Displaced unspecified condyle fracture of lower end of unspecified femur, initial encounter for closed fracture: Secondary | ICD-10-CM | POA: Insufficient documentation

## 2011-04-24 DIAGNOSIS — X58XXXA Exposure to other specified factors, initial encounter: Secondary | ICD-10-CM | POA: Insufficient documentation

## 2011-04-24 MED ORDER — IBUPROFEN 800 MG PO TABS
800.0000 mg | ORAL_TABLET | Freq: Once | ORAL | Status: AC
  Administered 2011-04-24: 800 mg via ORAL
  Filled 2011-04-24: qty 1

## 2011-04-24 MED ORDER — HYDROMORPHONE HCL PF 2 MG/ML IJ SOLN
2.0000 mg | Freq: Once | INTRAMUSCULAR | Status: AC
  Administered 2011-04-24: 2 mg via INTRAMUSCULAR
  Filled 2011-04-24: qty 1

## 2011-04-24 MED ORDER — OXYCODONE HCL 5 MG PO TABS: 5.0000 mg | ORAL_TABLET | ORAL | Status: DC | PRN

## 2011-04-24 NOTE — ED Notes (Signed)
Pt back from ct

## 2011-04-24 NOTE — ED Notes (Signed)
MD at bedside. 
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
## 2011-04-24 NOTE — ED Provider Notes (Signed)
History   This chart was scribed for Roy Bailiff, MD by Clarita Crane. The patient was seen in room APA02/APA02 and the patient's care was started at 10:04PM.   CSN: [id number] Arrival date & time: 04/24/2011  9:31 PM   First MD Initiated Contact with Patient 04/24/11 2139      Chief Complaint  Patient presents with  . Leg Pain   HPI Roy [name] is a 30 y.o. male who presents to the Emergency Department complaining of constant, moderate, non-radiating right knee pain with no associated symptoms onset 11 days ago but states pain became significantly worse yesterday. Patient reports he was evaluated on 04/13/2011 in ED with DG-Right Knee Complete and CT-Right Knee performed which revealed fracture to lateral condyle of right femur. Patient states he was advised not to bear weight on RLE but admits to walking without use of crutches on multiple occasions. Reports he currently has an appointment scheduled with Dr. Romeo Apple regarding fracture and future treatment. Denies numbness, tingling, fever.   Past Medical History  Diagnosis Date  . Cirrhosis     History reviewed. No pertinent past surgical history.  No family history on file.  History  Substance Use Topics  . Smoking status: Current Some Day Smoker -- 0.5 packs/day    Types: Cigarettes  . Smokeless tobacco: Not on file  . Alcohol Use: No      Review of Systems 10 Systems reviewed and are negative for acute change except as noted in the HPI.  Allergies  Acetaminophen; Darvocet; Hydrocodone; Toradol; and Tramadol  Home Medications   Current Outpatient Rx  Name Route Sig Dispense Refill  . CLINDAMYCIN HCL 150 MG PO CAPS Oral Take 150 mg by mouth 3 (three) times daily.      . OXYCODONE HCL 5 MG PO TABS Oral Take 1 tablet (5 mg total) by mouth every 4 (four) hours as needed for pain. 30 tablet 0  . OXYCODONE HCL 5 MG PO TABS Oral Take 1 tablet (5 mg total) by mouth every 4 (four) hours as needed for pain. 20 tablet 0      BP 139/81  Pulse 105  Temp(Src) 97.7 F (36.5 C) (Oral)  Resp 18  Ht 5\' 9"  (1.753 m)  Wt 160 lb (72.576 kg)  BMI 23.63 kg/m2  SpO2 100%  Physical Exam  Nursing note and vitals reviewed. Constitutional: He is oriented to person, place, and time. He appears well-developed and well-nourished. No distress.  HENT:  Head: Normocephalic and atraumatic.  Eyes: EOM are normal. Pupils are equal, round, and reactive to light.  Neck: Neck supple. No tracheal deviation present.  Cardiovascular: Normal rate and regular rhythm.  Exam reveals no gallop and no friction rub.   No murmur heard. Pulmonary/Chest: Effort normal. No respiratory distress. He has no wheezes.  Abdominal: He exhibits no distension.  Musculoskeletal: Normal range of motion. He exhibits no edema.       Right knee immobilized with brace. Limited ROM of right knee secondary to pain. Palpable swelling of right knee. Tenderness to palpation more so to distal femur as opposed to tibial plateau.   Neurological: He is alert and oriented to person, place, and time. No sensory deficit.  Skin: Skin is warm and dry.  Psychiatric: He has a normal mood and affect. His behavior is normal.    ED Course  Procedures (including critical care time)  DIAGNOSTIC STUDIES: Oxygen Saturation is 100% on room air, normal by my interpretation.    COORDINATION  OF CARE:    Labs Reviewed - No data to display Ct Knee Right Wo Contrast  04/24/2011  *RADIOLOGY REPORT*  Clinical Data: Right knee pain.  CT OF THE RIGHT KNEE WITHOUT CONTRAST  Technique:  Multidetector CT imaging was performed according to the standard protocol. Multiplanar CT image reconstructions were also generated.  Comparison: 04/13/2011 CT  Findings: Comminuted/impacted fracture of the anterior aspect of the lateral femoral condyle with the largest displaced fragment located laterally. Several of the smaller fragments have migrated within the joint space, with a 6 mm fragment  noted anterior to the medial femoral condyle.  There is a small fragment along the lateral aspect of the medial tibial plateau (image 43 series 4), not appreciated previously.  Large joint effusion with lipohemarthrosis again appreciated, evolved in the interval.  No additional fractures identified.  IMPRESSION: Comminuted/impacted fracture of the anterior aspect of the lateral femoral condyle is without significant interval change.  Small fragment favored to arise from the medial tibial plateau (image 43 series 4), not appreciated previously.  Original Report Authenticated By: Waneta Martins, M.D.     1. Femoral condyle fracture       MDM  No significant worsening of the patient's fracture on repeat CT imaging given his ambulation. He was placed back in his knee immobilizer and will be provided crutches. I encouraged him not to ambulate as this will make his fracture potentially worse. I will discharge him home with oxycodone and instructions to followup with Dr. Romeo Apple as directed. Repeat imaging was performed as I was concerned that there was worsening of his injury given the amount of ambulation he has been doing.      I personally performed the services described in this documentation, which was scribed in my presence. The recorded information has been reviewed and considered.    Roy Bailiff, MD 04/24/11 [phone]

## 2011-04-26 ENCOUNTER — Ambulatory Visit (INDEPENDENT_AMBULATORY_CARE_PROVIDER_SITE_OTHER): Payer: Self-pay | Admitting: Orthopedic Surgery

## 2011-04-26 ENCOUNTER — Encounter: Payer: Self-pay | Admitting: Orthopedic Surgery

## 2011-04-26 VITALS — BP 130/80 | Ht 69.0 in | Wt 160.0 lb

## 2011-04-26 DIAGNOSIS — S72401A Unspecified fracture of lower end of right femur, initial encounter for closed fracture: Secondary | ICD-10-CM

## 2011-04-26 DIAGNOSIS — S72409A Unspecified fracture of lower end of unspecified femur, initial encounter for closed fracture: Secondary | ICD-10-CM

## 2011-04-26 MED ORDER — PROMETHAZINE HCL 12.5 MG PO TABS: 12.5000 mg | ORAL_TABLET | Freq: Four times a day (QID) | ORAL | Status: DC | PRN

## 2011-04-26 MED ORDER — HYDROCODONE-ACETAMINOPHEN 7.5-325 MG PO TABS: 1.0000 | ORAL_TABLET | ORAL | Status: DC | PRN

## 2011-04-26 NOTE — Patient Instructions (Signed)
Wear brace on knee when you walk   Crutches

## 2011-04-27 ENCOUNTER — Encounter: Payer: Self-pay | Admitting: Orthopedic Surgery

## 2011-04-27 DIAGNOSIS — S72401A Unspecified fracture of lower end of right femur, initial encounter for closed fracture: Secondary | ICD-10-CM | POA: Insufficient documentation
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
## 2011-05-04 ENCOUNTER — Encounter (HOSPITAL_COMMUNITY): Payer: Self-pay

## 2011-05-04 ENCOUNTER — Emergency Department (HOSPITAL_COMMUNITY)
Admission: EM | Admit: 2011-05-04 | Discharge: 2011-05-05 | Disposition: A | Discharge status: Home / Self Care | Payer: Self-pay | Attending: Emergency Medicine | Admitting: Emergency Medicine

## 2011-05-04 DIAGNOSIS — IMO0002 Reserved for concepts with insufficient information to code with codable children: Secondary | ICD-10-CM | POA: Insufficient documentation

## 2011-05-04 DIAGNOSIS — S129XXA Fracture of neck, unspecified, initial encounter: Secondary | ICD-10-CM

## 2011-05-04 DIAGNOSIS — F17200 Nicotine dependence, unspecified, uncomplicated: Secondary | ICD-10-CM | POA: Insufficient documentation

## 2011-05-04 DIAGNOSIS — M542 Cervicalgia: Secondary | ICD-10-CM | POA: Insufficient documentation
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
## 2011-05-05 MED ORDER — CYCLOBENZAPRINE HCL 10 MG PO TABS: 10.0000 mg | ORAL_TABLET | Freq: Two times a day (BID) | ORAL | Status: AC | PRN

## 2011-05-05 MED ORDER — IBUPROFEN 800 MG PO TABS: 800.0000 mg | ORAL_TABLET | Freq: Three times a day (TID) | ORAL | Status: AC
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
## 2011-05-07 ENCOUNTER — Emergency Department (HOSPITAL_COMMUNITY)
Admission: EM | Admit: 2011-05-07 | Discharge: 2011-05-07 | Disposition: A | Discharge status: Home / Self Care | Payer: Self-pay | Attending: Emergency Medicine | Admitting: Emergency Medicine

## 2011-05-07 ENCOUNTER — Encounter (HOSPITAL_COMMUNITY): Payer: Self-pay

## 2011-05-07 DIAGNOSIS — S82899A Other fracture of unspecified lower leg, initial encounter for closed fracture: Secondary | ICD-10-CM | POA: Insufficient documentation

## 2011-05-07 DIAGNOSIS — M542 Cervicalgia: Secondary | ICD-10-CM | POA: Insufficient documentation

## 2011-05-07 DIAGNOSIS — F17200 Nicotine dependence, unspecified, uncomplicated: Secondary | ICD-10-CM | POA: Insufficient documentation

## 2011-05-07 DIAGNOSIS — IMO0002 Reserved for concepts with insufficient information to code with codable children: Secondary | ICD-10-CM | POA: Insufficient documentation

## 2011-05-07 DIAGNOSIS — M25569 Pain in unspecified knee: Secondary | ICD-10-CM | POA: Insufficient documentation

## 2011-05-07 DIAGNOSIS — K7460 Unspecified cirrhosis of liver: Secondary | ICD-10-CM | POA: Insufficient documentation

## 2011-05-07 MED ORDER — OXYCODONE HCL 5 MG PO TABS
ORAL_TABLET | ORAL | Status: AC
  Administered 2011-05-07: 5 mg
  Filled 2011-05-07: qty 1

## 2011-05-07 MED ORDER — OXYCODONE HCL 5 MG PO TABS: ORAL_TABLET | ORAL | Status: DC
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
## 2011-05-08 NOTE — ED Provider Notes (Signed)
Medical screening examination/treatment/procedure(s) were performed by non-physician practitioner and as supervising physician I was immediately available for consultation/collaboration. Julieth Tugman, MD, FACEP   Kimbella Heisler L Brenda Cowher, MD 05/08/11 0133 

## 2011-05-18 ENCOUNTER — Ambulatory Visit (INDEPENDENT_AMBULATORY_CARE_PROVIDER_SITE_OTHER): Payer: Self-pay | Admitting: Orthopedic Surgery

## 2011-05-18 ENCOUNTER — Encounter: Payer: Self-pay | Admitting: Orthopedic Surgery

## 2011-05-18 VITALS — BP 118/70 | Ht 69.0 in | Wt 160.0 lb

## 2011-05-18 DIAGNOSIS — S72401A Unspecified fracture of lower end of right femur, initial encounter for closed fracture: Secondary | ICD-10-CM

## 2011-05-18 DIAGNOSIS — S72409A Unspecified fracture of lower end of unspecified femur, initial encounter for closed fracture: Secondary | ICD-10-CM

## 2011-05-18 MED ORDER — PROMETHAZINE HCL 12.5 MG PO TABS: 12.5000 mg | ORAL_TABLET | Freq: Four times a day (QID) | ORAL | Status: DC | PRN

## 2011-05-18 MED ORDER — HYDROCODONE-ACETAMINOPHEN 7.5-325 MG PO TABS: 1.0000 | ORAL_TABLET | ORAL | Status: DC | PRN

## 2011-05-18 NOTE — Patient Instructions (Signed)
Start bending knee 25 times 3 x a day   Brace when walking

## 2011-05-18 NOTE — Progress Notes (Signed)
Followup visit.  Status post fracture, RIGHT femoral condyle.  Patient placed in knee immobilizer and comes in for his 3 week followup visit. Last night had a lot of pain may have been from the weather.He has Approximately 45 of knee flexion.  Recommend range of motion exercises brace for walking and then we will see him in 3 weeks. If he is not able to bend his knee. Then, we would recommend arthroscopic surgery to remove any loose bodies

## 2011-05-20 ENCOUNTER — Encounter (HOSPITAL_COMMUNITY): Payer: Self-pay | Admitting: *Deleted

## 2011-05-20 ENCOUNTER — Emergency Department (HOSPITAL_COMMUNITY)
Admission: EM | Admit: 2011-05-20 | Discharge: 2011-05-20 | Disposition: A | Discharge status: Home / Self Care | Payer: Self-pay | Attending: Emergency Medicine | Admitting: Emergency Medicine

## 2011-05-20 DIAGNOSIS — F17200 Nicotine dependence, unspecified, uncomplicated: Secondary | ICD-10-CM | POA: Insufficient documentation

## 2011-05-20 DIAGNOSIS — R609 Edema, unspecified: Secondary | ICD-10-CM | POA: Insufficient documentation

## 2011-05-20 DIAGNOSIS — X500XXA Overexertion from strenuous movement or load, initial encounter: Secondary | ICD-10-CM | POA: Insufficient documentation

## 2011-05-20 DIAGNOSIS — K7460 Unspecified cirrhosis of liver: Secondary | ICD-10-CM | POA: Insufficient documentation

## 2011-05-20 DIAGNOSIS — S72409A Unspecified fracture of lower end of unspecified femur, initial encounter for closed fracture: Secondary | ICD-10-CM

## 2011-05-20 DIAGNOSIS — M25469 Effusion, unspecified knee: Secondary | ICD-10-CM | POA: Insufficient documentation

## 2011-05-20 DIAGNOSIS — M25569 Pain in unspecified knee: Secondary | ICD-10-CM | POA: Insufficient documentation

## 2011-05-20 MED ORDER — IBUPROFEN 600 MG PO TABS: 600.0000 mg | ORAL_TABLET | Freq: Four times a day (QID) | ORAL | Status: AC | PRN
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
## 2011-05-25 NOTE — ED Provider Notes (Signed)
Medical screening examination/treatment/procedure(s) were performed by non-physician practitioner and as supervising physician I was immediately available for consultation/collaboration.  Nicoletta Dress. Colon Branch, MD 05/25/11 [phone]

## 2011-06-03 ENCOUNTER — Telehealth: Payer: Self-pay | Admitting: Orthopedic Surgery

## 2011-06-03 NOTE — Telephone Encounter (Signed)
Mother called about patient's upcoming follow up appointment for fracture care; states patient will have to call back to re-schedule; said he's been incarcerated.  Her phone # [phone]) is best contact phone #.

## 2011-06-08 ENCOUNTER — Emergency Department (HOSPITAL_COMMUNITY)
Admission: EM | Admit: 2011-06-08 | Discharge: 2011-06-08 | Disposition: A | Discharge status: Home / Self Care | Payer: Self-pay | Attending: Emergency Medicine | Admitting: Emergency Medicine

## 2011-06-08 ENCOUNTER — Encounter (HOSPITAL_COMMUNITY): Payer: Self-pay | Admitting: Emergency Medicine

## 2011-06-08 ENCOUNTER — Encounter (HOSPITAL_COMMUNITY): Payer: Self-pay | Admitting: *Deleted

## 2011-06-08 ENCOUNTER — Emergency Department (HOSPITAL_COMMUNITY): Payer: Self-pay

## 2011-06-08 DIAGNOSIS — F17200 Nicotine dependence, unspecified, uncomplicated: Secondary | ICD-10-CM | POA: Insufficient documentation

## 2011-06-08 DIAGNOSIS — Z760 Encounter for issue of repeat prescription: Secondary | ICD-10-CM | POA: Insufficient documentation

## 2011-06-08 DIAGNOSIS — M25569 Pain in unspecified knee: Secondary | ICD-10-CM | POA: Insufficient documentation

## 2011-06-08 DIAGNOSIS — W010XXA Fall on same level from slipping, tripping and stumbling without subsequent striking against object, initial encounter: Secondary | ICD-10-CM | POA: Insufficient documentation

## 2011-06-08 DIAGNOSIS — S72413A Displaced unspecified condyle fracture of lower end of unspecified femur, initial encounter for closed fracture: Secondary | ICD-10-CM | POA: Insufficient documentation

## 2011-06-08 MED ORDER — OXYCODONE HCL 7.5 MG PO TABS: 1.0000 | ORAL_TABLET | Freq: Three times a day (TID) | ORAL | Status: DC

## 2011-06-08 MED ORDER — HYDROCODONE-ACETAMINOPHEN 5-325 MG PO TABS: 2.0000 | ORAL_TABLET | ORAL | Status: AC | PRN

## 2011-06-08 MED ORDER — OXYCODONE HCL 5 MG PO TABS
10.0000 mg | ORAL_TABLET | Freq: Once | ORAL | Status: AC
  Administered 2011-06-08: 10 mg via ORAL
  Filled 2011-06-08: qty 2
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
## 2011-06-08 NOTE — ED Notes (Signed)
States that he has ride once d/c, plan to call mother once d/c'd
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
## 2011-06-08 NOTE — ED Notes (Signed)
Pt reports slipping off of porch on Sunday night and hurt right knee.  Knee presently in immobilizer brace. Reports throbbing pain.

## 2011-06-09 NOTE — ED Provider Notes (Signed)
Medical screening examination/treatment/procedure(s) were performed by non-physician practitioner and as supervising physician I was immediately available for consultation/collaboration.  Nicoletta Dress. Colon Branch, MD 06/09/11 1340

## 2011-06-10 ENCOUNTER — Ambulatory Visit: Payer: Self-pay | Admitting: Orthopedic Surgery

## 2011-06-19 ENCOUNTER — Emergency Department (HOSPITAL_COMMUNITY): Payer: Self-pay

## 2011-06-19 ENCOUNTER — Encounter (HOSPITAL_COMMUNITY): Payer: Self-pay

## 2011-06-19 ENCOUNTER — Emergency Department (HOSPITAL_COMMUNITY)
Admission: EM | Admit: 2011-06-19 | Discharge: 2011-06-19 | Disposition: A | Discharge status: Home / Self Care | Payer: Self-pay | Attending: Emergency Medicine | Admitting: Emergency Medicine

## 2011-06-19 DIAGNOSIS — X500XXA Overexertion from strenuous movement or load, initial encounter: Secondary | ICD-10-CM | POA: Insufficient documentation

## 2011-06-19 DIAGNOSIS — F17200 Nicotine dependence, unspecified, uncomplicated: Secondary | ICD-10-CM | POA: Insufficient documentation

## 2011-06-19 DIAGNOSIS — M25569 Pain in unspecified knee: Secondary | ICD-10-CM | POA: Insufficient documentation

## 2011-06-19 MED ORDER — OXYCODONE-ACETAMINOPHEN 5-325 MG PO TABS
1.0000 | ORAL_TABLET | Freq: Once | ORAL | Status: AC
  Administered 2011-06-19: 1 via ORAL
  Filled 2011-06-19: qty 1

## 2011-06-19 NOTE — ED Notes (Signed)
Pt presents with right knee pain. Pt states he "re-injured" his knee by slipping off of his porch. Pt states he had an appointment with Dr Romeo Apple but missed it because he was out of town for Christmas.

## 2011-06-19 NOTE — ED Provider Notes (Signed)
This chart was scribed for [employer]. Rubin Payor, MD by Wallis Mart. The patient was seen in room APA19/APA19 and the patient's care was started at 9:23 PM.   CSN: [id number]  Arrival date & time 06/19/11  Roy [name]   First MD Initiated Contact with Patient 06/19/11 2114      Chief Complaint  Patient presents with  . Knee Pain    (Consider location/radiation/quality/duration/timing/severity/associated sxs/prior treatment) HPI  Roy [name] is a 30 y.o. male who presents to the Emergency Department complaining of sudden onset, persistence of constant moderate to severe right knee pain that started last night. Pt stepped off his porch stair and twisted his knee. Nothing improves or worsens the pain and pt denies any other pain or symptoms.   Pt has reports he has injured the knee prior.     Past Medical History  Diagnosis Date  . Cirrhosis     History reviewed. No pertinent past surgical history.  No family history on file.  History  Substance Use Topics  . Smoking status: Current Some Day Smoker -- 0.5 packs/day    Types: Cigarettes  . Smokeless tobacco: Not on file  . Alcohol Use: No      Review of Systems 10 Systems reviewed and are negative for acute change except as noted in the HPI.   Allergies  Acetaminophen; Darvocet; Hydrocodone; Toradol; and Tramadol  Home Medications   Current Outpatient Rx  Name Route Sig Dispense Refill  . HYDROCODONE-ACETAMINOPHEN 5-325 MG PO TABS Oral Take 2 tablets by mouth every 4 (four) hours as needed for pain. 6 tablet 0  . OXYCODONE HCL (ABUSE DETER) 7.5 MG PO TABS Oral Take 1 tablet by mouth 3 (three) times daily. Patient out of medication       BP 159/78  Pulse 92  Temp(Src) 97.9 F (36.6 C) (Oral)  Resp 18  Ht 5\' 9"  (1.753 m)  Wt 160 lb (72.576 kg)  BMI 23.63 kg/m2  SpO2 100%  Physical Exam  Nursing note and vitals reviewed. Constitutional: He is oriented to person, place, and time. He appears well-developed  and well-nourished. No distress.  HENT:  Head: Normocephalic and atraumatic.  Eyes: EOM are normal. Pupils are equal, round, and reactive to light.  Neck: Neck supple. No tracheal deviation present.  Cardiovascular: Normal rate.   Pulmonary/Chest: Effort normal. No respiratory distress.  Abdominal: Soft. He exhibits no distension.  Musculoskeletal: He exhibits no edema.       Tenderness anteriorly over patella No crepitance   Neurological: He is alert and oriented to person, place, and time. No sensory deficit.  Skin: Skin is warm and dry.  Psychiatric: He has a normal mood and affect. His behavior is normal.    ED Course  Procedures (including critical care time) DIAGNOSTIC STUDIES: Oxygen Saturation is 100% on room air, normal by my interpretation.    COORDINATION OF CARE:    Labs Reviewed - No data to display Dg Knee Complete 4 Views Right  06/19/2011  *RADIOLOGY REPORT*  Clinical Data: Twisting injury with posterior knee pain.  RIGHT KNEE - COMPLETE 4+ VIEW  Comparison: 06/08/2011  Findings: The fracture of the anterior aspect of the lateral femoral condyle is less well-defined today, likely due to interval healing.  No evidence of acute superimposed fracture.  Small suprapatellar joint effusion is suspected.  IMPRESSION: Interval healing of nonacute anterior lateral femoral condylar fracture.  Probable small joint effusion.  No acute superimposed osseous finding.  Original Report Authenticated By: Ronaldo Miyamoto  D. TALBOT, M.D.     1. Knee pain       MDM  Patient presents with knee pain after reentering his knee. She's post surgery. He also states he is out of his pain medicine that she's been getting. He sees Dr. Romeo Apple. He states he missed an appointment because he was out of town and thought was on a different day. He has had 23 visits to the ER last 6 months. Recently has been through the fractures, but it multiple previous visits for dental pain before that. He was told he  would not be getting any more pain meds from the ER for this. He'll be discharged home. he has a knee immobilizer I personally performed the services described in this documentation, which was scribed in my presence. The recorded information has been reviewed and considered.      Juliet Rude. Rubin Payor, MD 06/19/11 2237

## 2011-07-07 ENCOUNTER — Encounter (HOSPITAL_COMMUNITY): Payer: Self-pay | Admitting: *Deleted

## 2011-07-07 ENCOUNTER — Emergency Department (HOSPITAL_COMMUNITY)
Admission: EM | Admit: 2011-07-07 | Discharge: 2011-07-07 | Disposition: A | Discharge status: Home / Self Care | Payer: Self-pay | Attending: Emergency Medicine | Admitting: Emergency Medicine

## 2011-07-07 DIAGNOSIS — X503XXA Overexertion from repetitive movements, initial encounter: Secondary | ICD-10-CM | POA: Insufficient documentation

## 2011-07-07 DIAGNOSIS — K7460 Unspecified cirrhosis of liver: Secondary | ICD-10-CM | POA: Insufficient documentation

## 2011-07-07 DIAGNOSIS — S335XXA Sprain of ligaments of lumbar spine, initial encounter: Secondary | ICD-10-CM | POA: Insufficient documentation

## 2011-07-07 DIAGNOSIS — M545 Low back pain, unspecified: Secondary | ICD-10-CM | POA: Insufficient documentation

## 2011-07-07 DIAGNOSIS — S39012A Strain of muscle, fascia and tendon of lower back, initial encounter: Secondary | ICD-10-CM

## 2011-07-07 MED ORDER — CYCLOBENZAPRINE HCL 10 MG PO TABS
10.0000 mg | ORAL_TABLET | Freq: Once | ORAL | Status: AC
  Administered 2011-07-07: 10 mg via ORAL
  Filled 2011-07-07: qty 1

## 2011-07-07 MED ORDER — CYCLOBENZAPRINE HCL 5 MG PO TABS: 5.0000 mg | ORAL_TABLET | Freq: Three times a day (TID) | ORAL | Status: AC | PRN

## 2011-07-07 MED ORDER — IBUPROFEN 600 MG PO TABS: 600.0000 mg | ORAL_TABLET | Freq: Three times a day (TID) | ORAL | Status: AC | PRN

## 2011-07-07 MED ORDER — IBUPROFEN 800 MG PO TABS
800.0000 mg | ORAL_TABLET | Freq: Once | ORAL | Status: AC
  Administered 2011-07-07: 800 mg via ORAL
  Filled 2011-07-07: qty 1

## 2011-07-07 NOTE — ED Notes (Signed)
Moved pallet of concrete , twisted and hurt low lt back

## 2011-07-07 NOTE — ED Provider Notes (Signed)
History     CSN: [id number]  Arrival date & time 07/07/11  1831   First MD Initiated Contact with Patient 07/07/11 1916      Chief Complaint  Patient presents with  . Back Pain    (Consider location/radiation/quality/duration/timing/severity/associated sxs/prior treatment) Patient is a 31 y.o. male presenting with back pain. The history is provided by the patient.  Back Pain  This is a new problem. The current episode started yesterday. The problem occurs constantly. The problem has not changed since onset.Associated with: He injured his lower back  lifting bags of cement at work. Pain location: Paralumbar. The quality of the pain is described as stabbing and aching. The pain does not radiate. The pain is at a severity of 10/10. The pain is severe. The symptoms are aggravated by bending, twisting and certain positions. Pertinent negatives include no chest pain, no fever, no numbness, no headaches, no abdominal pain, no abdominal swelling, no bowel incontinence, no perianal numbness, no bladder incontinence, no dysuria, no paresthesias, no paresis, no tingling and no weakness. He has tried NSAIDs for the symptoms. The treatment provided no relief.    Past Medical History  Diagnosis Date  . Cirrhosis     History reviewed. No pertinent past surgical history.  History reviewed. No pertinent family history.  History  Substance Use Topics  . Smoking status: Current Some Day Smoker -- 0.5 packs/day    Types: Cigarettes  . Smokeless tobacco: Not on file  . Alcohol Use: No      Review of Systems  Constitutional: Negative for fever.  HENT: Negative for congestion, sore throat and neck pain.   Eyes: Negative.   Respiratory: Negative for chest tightness and shortness of breath.   Cardiovascular: Negative for chest pain.  Gastrointestinal: Negative for nausea, abdominal pain and bowel incontinence.  Genitourinary: Negative.  Negative for bladder incontinence and dysuria.    Musculoskeletal: Positive for back pain. Negative for joint swelling and arthralgias.  Skin: Negative.  Negative for rash and wound.  Neurological: Negative for dizziness, tingling, weakness, light-headedness, numbness, headaches and paresthesias.  Hematological: Negative.   Psychiatric/Behavioral: Negative.     Allergies  Acetaminophen; Darvocet; Hydrocodone; Toradol; and Tramadol  Home Medications   Current Outpatient Rx  Name Route Sig Dispense Refill  . CYCLOBENZAPRINE HCL 5 MG PO TABS Oral Take 1 tablet (5 mg total) by mouth 3 (three) times daily as needed for muscle spasms. 30 tablet 0  . IBUPROFEN 600 MG PO TABS Oral Take 1 tablet (600 mg total) by mouth every 8 (eight) hours as needed for pain. 30 tablet 0    BP 134/73  Pulse 96  Temp(Src) 98.7 F (37.1 C) (Oral)  Resp 18  Ht 5\' 9"  (1.753 m)  Wt 160 lb (72.576 kg)  BMI 23.63 kg/m2  Physical Exam  Nursing note and vitals reviewed. Constitutional: He is oriented to person, place, and time. He appears well-developed and well-nourished.  HENT:  Head: Normocephalic.  Eyes: Conjunctivae are normal.  Neck: Normal range of motion. Neck supple.  Cardiovascular: Regular rhythm and intact distal pulses.        Pedal pulses normal.  Pulmonary/Chest: Effort normal. He has no wheezes.  Abdominal: Soft. Bowel sounds are normal. He exhibits no distension and no mass.  Musculoskeletal: Normal range of motion. He exhibits no edema.       Lumbar back: He exhibits tenderness. He exhibits no swelling, no edema and no spasm.  Neurological: He is alert and oriented to person,  place, and time. He has normal strength. He displays no atrophy and no tremor. No cranial nerve deficit or sensory deficit. Gait normal.  Reflex Scores:      Patellar reflexes are 2+ on the right side and 2+ on the left side.      Achilles reflexes are 2+ on the right side and 2+ on the left side.      No strength deficit noted in hip and knee flexor and extensor  muscle groups.  Ankle flexion and extension intact.  Skin: Skin is warm and dry.  Psychiatric: He has a normal mood and affect.    ED Course  Procedures (including critical care time)  Labs Reviewed - No data to display No results found.   1. Lumbar strain       MDM   Ibuprofen 600 mg, Flexeril 5 mg 3 times a day. Patient also encouraged to use warm soaks or heating pad on lower back for muscle strain relief.  No neuro deficit on exam or by history to suggest emergent or surgical presentation.           Candis Musa, PA 07/07/11 1940

## 2011-07-07 NOTE — ED Provider Notes (Signed)
Medical screening examination/treatment/procedure(s) were performed by non-physician practitioner and as supervising physician I was immediately available for consultation/collaboration.   Yanuel Tagg, MD 07/07/11 2029 

## 2011-07-07 NOTE — ED Notes (Signed)
Pt states was at work yesterday when he lifted a pallet with another coworker and "pulled a muscle in lower back". Pt states pain stays in lower back.

## 2011-07-11 ENCOUNTER — Encounter (HOSPITAL_COMMUNITY): Payer: Self-pay | Admitting: *Deleted

## 2011-07-11 ENCOUNTER — Emergency Department (HOSPITAL_COMMUNITY)
Admission: EM | Admit: 2011-07-11 | Discharge: 2011-07-11 | Disposition: A | Discharge status: Home / Self Care | Payer: Self-pay | Attending: Emergency Medicine | Admitting: Emergency Medicine

## 2011-07-11 DIAGNOSIS — M549 Dorsalgia, unspecified: Secondary | ICD-10-CM | POA: Insufficient documentation

## 2011-07-11 DIAGNOSIS — S29011A Strain of muscle and tendon of front wall of thorax, initial encounter: Secondary | ICD-10-CM

## 2011-07-11 DIAGNOSIS — F17200 Nicotine dependence, unspecified, uncomplicated: Secondary | ICD-10-CM | POA: Insufficient documentation

## 2011-07-11 DIAGNOSIS — IMO0002 Reserved for concepts with insufficient information to code with codable children: Secondary | ICD-10-CM | POA: Insufficient documentation

## 2011-07-11 DIAGNOSIS — X503XXA Overexertion from repetitive movements, initial encounter: Secondary | ICD-10-CM | POA: Insufficient documentation

## 2011-07-11 MED ORDER — CYCLOBENZAPRINE HCL 10 MG PO TABS: ORAL_TABLET | ORAL | Status: DC

## 2011-07-11 MED ORDER — OXYCODONE HCL 5 MG PO TABS
10.0000 mg | ORAL_TABLET | Freq: Once | ORAL | Status: AC
  Administered 2011-07-11: 10 mg via ORAL
  Filled 2011-07-11: qty 2

## 2011-07-11 MED ORDER — CYCLOBENZAPRINE HCL 10 MG PO TABS
10.0000 mg | ORAL_TABLET | Freq: Once | ORAL | Status: AC
  Administered 2011-07-11: 10 mg via ORAL
  Filled 2011-07-11: qty 1

## 2011-07-11 MED ORDER — OXYCODONE HCL 5 MG PO TABS: 5.0000 mg | ORAL_TABLET | ORAL | Status: AC | PRN

## 2011-07-11 NOTE — ED Provider Notes (Signed)
Medical screening examination/treatment/procedure(s) were performed by non-physician practitioner and as supervising physician I was immediately available for consultation/collaboration.   Hurman Horn, MD 07/11/11 [phone]

## 2011-07-11 NOTE — ED Notes (Signed)
Injured back last week by lifting something heavy, thinks he pulled a muscle, middle left back pain

## 2011-07-11 NOTE — ED Notes (Signed)
Pt a/ox4. Resp even and unlabored. NAD at this time. D/C instructions reviewed with pt. Pt verbalized understanding. Pt ambulated to lobby with steady gate.  

## 2011-07-11 NOTE — ED Provider Notes (Signed)
History     CSN: [id number]  Arrival date & time 07/11/11  2135   None     Chief Complaint  Patient presents with  . Back Pain    (Consider location/radiation/quality/duration/timing/severity/associated sxs/prior treatment) HPI Comments: Pt and co-worker were lifting a pallet that had several bags of concrete on it hurting his back.  Patient is a 31 y.o. male presenting with back pain. The history is provided by the patient. No language interpreter was used.  Back Pain  This is a new problem. Episode onset: several days ago. The problem occurs constantly. The problem has not changed since onset.The pain is associated with lifting heavy objects. Pain location: L inferior/lateral rib area. The quality of the pain is described as stabbing. The pain does not radiate. The pain is at a severity of 7/10. The symptoms are aggravated by bending and twisting. Pertinent negatives include no fever.    Past Medical History  Diagnosis Date  . Cirrhosis   . Back pain     History reviewed. No pertinent past surgical history.  History reviewed. No pertinent family history.  History  Substance Use Topics  . Smoking status: Current Some Day Smoker -- 0.5 packs/day    Types: Cigarettes  . Smokeless tobacco: Not on file  . Alcohol Use: No      Review of Systems  Constitutional: Negative for fever.  Musculoskeletal: Positive for back pain.  All other systems reviewed and are negative.    Allergies  Acetaminophen; Darvocet; Hydrocodone; Toradol; and Tramadol  Home Medications   Current Outpatient Rx  Name Route Sig Dispense Refill  . CYCLOBENZAPRINE HCL 5 MG PO TABS Oral Take 1 tablet (5 mg total) by mouth 3 (three) times daily as needed for muscle spasms. 30 tablet 0  . IBUPROFEN 600 MG PO TABS Oral Take 1 tablet (600 mg total) by mouth every 8 (eight) hours as needed for pain. 30 tablet 0    BP 142/76  Pulse 104  Temp(Src) 97.3 F (36.3 C) (Oral)  Resp 20  Ht 5\' 9"  (1.753  m)  Wt 160 lb (72.576 kg)  BMI 23.63 kg/m2  SpO2 98%  Physical Exam  Nursing note and vitals reviewed. Constitutional: He is oriented to person, place, and time. He appears well-developed and well-nourished.  HENT:  Head: Normocephalic and atraumatic.  Eyes: EOM are normal.  Neck: Normal range of motion.  Cardiovascular: Normal rate, regular rhythm, normal heart sounds and intact distal pulses.   Pulmonary/Chest: Effort normal and breath sounds normal. No respiratory distress.  Abdominal: Soft. He exhibits no distension. There is no tenderness.  Musculoskeletal: He exhibits tenderness.       Arms: Neurological: He is alert and oriented to person, place, and time.  Skin: Skin is warm and dry.  Psychiatric: He has a normal mood and affect. Judgment normal.    ED Course  Procedures (including critical care time)  Labs Reviewed - No data to display No results found.   No diagnosis found.    MDM          Worthy Rancher, PA 07/11/11 2259

## 2011-07-18 ENCOUNTER — Encounter (HOSPITAL_COMMUNITY): Payer: Self-pay | Admitting: *Deleted

## 2011-07-18 ENCOUNTER — Emergency Department (HOSPITAL_COMMUNITY)
Admission: EM | Admit: 2011-07-18 | Discharge: 2011-07-18 | Disposition: A | Discharge status: Home / Self Care | Payer: Self-pay | Attending: Emergency Medicine | Admitting: Emergency Medicine

## 2011-07-18 DIAGNOSIS — M545 Low back pain, unspecified: Secondary | ICD-10-CM | POA: Insufficient documentation

## 2011-07-18 DIAGNOSIS — S39012A Strain of muscle, fascia and tendon of lower back, initial encounter: Secondary | ICD-10-CM

## 2011-07-18 DIAGNOSIS — S335XXA Sprain of ligaments of lumbar spine, initial encounter: Secondary | ICD-10-CM | POA: Insufficient documentation

## 2011-07-18 DIAGNOSIS — X500XXA Overexertion from strenuous movement or load, initial encounter: Secondary | ICD-10-CM | POA: Insufficient documentation

## 2011-07-18 DIAGNOSIS — F17200 Nicotine dependence, unspecified, uncomplicated: Secondary | ICD-10-CM | POA: Insufficient documentation

## 2011-07-18 MED ORDER — DEXAMETHASONE SODIUM PHOSPHATE 4 MG/ML IJ SOLN
8.0000 mg | Freq: Once | INTRAMUSCULAR | Status: AC
  Administered 2011-07-18: 8 mg via INTRAMUSCULAR
  Filled 2011-07-18: qty 2

## 2011-07-18 MED ORDER — DEXAMETHASONE 4 MG PO TABS: 4.0000 mg | ORAL_TABLET | Freq: Two times a day (BID) | ORAL | Status: DC

## 2011-07-18 MED ORDER — OXYCODONE HCL 5 MG PO TABS
5.0000 mg | ORAL_TABLET | Freq: Once | ORAL | Status: AC
  Administered 2011-07-18: 5 mg via ORAL
  Filled 2011-07-18: qty 1

## 2011-07-18 MED ORDER — OXYCODONE HCL 5 MG PO TABS: 5.0000 mg | ORAL_TABLET | Freq: Four times a day (QID) | ORAL | Status: DC | PRN

## 2011-07-18 NOTE — ED Notes (Signed)
Pt c/o lower back pain. Pt states he was lifting something heavy a week ago and felt something "slip".

## 2011-07-18 NOTE — ED Notes (Signed)
Pt a/ox4. Resp even and unlabored. NAD at this time. D/C instructions reviewed with pt. Pt verbalized understanding. Pt ambulated to lobby with steady gate.  

## 2011-07-18 NOTE — ED Provider Notes (Signed)
History     CSN: [id number]  Arrival date & time 07/18/11  Paulo Fruit   First MD Initiated Contact with Patient 07/18/11 1951      Chief Complaint  Patient presents with  . Back Pain    (Consider location/radiation/quality/duration/timing/severity/associated sxs/prior treatment) HPI Comments: Patient states that on January 18 he was lifting a heavy object with a mother coworker and injured his back. He was seen in the emergency department at that time he was treated with Roxicodone and Flexeril. He states that he went back to work using the medications and was able to work until January 24 when he ran out of his medication. He states that he has an appointment with a doctor in [place] to evaluate his back on February 1. He presents at this time requesting pain medicine. The patient states that he" must go back to work" or lose his job. Patient further states that he cannot take medications with Tylenol to  cirrhosis of the liver.  Patient is a 31 y.o. male presenting with back pain. The history is provided by the patient.  Back Pain  This is a recurrent problem. Pertinent negatives include no chest pain, no abdominal pain and no dysuria.    Past Medical History  Diagnosis Date  . Cirrhosis   . Back pain     History reviewed. No pertinent past surgical history.  History reviewed. No pertinent family history.  History  Substance Use Topics  . Smoking status: Current Some Day Smoker -- 0.5 packs/day    Types: Cigarettes  . Smokeless tobacco: Not on file  . Alcohol Use: No      Review of Systems  Constitutional: Negative for activity change.       All ROS Neg except as noted in HPI  HENT: Negative for nosebleeds and neck pain.   Eyes: Negative for photophobia and discharge.  Respiratory: Negative for cough, shortness of breath and wheezing.   Cardiovascular: Negative for chest pain and palpitations.  Gastrointestinal: Negative for abdominal pain and blood in stool.    Genitourinary: Negative for dysuria, frequency and hematuria.  Musculoskeletal: Positive for back pain. Negative for arthralgias.  Skin: Negative.   Neurological: Negative for dizziness, seizures and speech difficulty.  Psychiatric/Behavioral: Negative for hallucinations and confusion.    Allergies  Acetaminophen; Darvocet; Hydrocodone; Toradol; and Tramadol  Home Medications   Current Outpatient Rx  Name Route Sig Dispense Refill  . CYCLOBENZAPRINE HCL 10 MG PO TABS  Take 1/2 to one tablet 3 times daily. 20 tablet 0  . CYCLOBENZAPRINE HCL 5 MG PO TABS Oral Take 1 tablet (5 mg total) by mouth 3 (three) times daily as needed for muscle spasms. 30 tablet 0  . DEXAMETHASONE 4 MG PO TABS Oral Take 1 tablet (4 mg total) by mouth 2 (two) times daily with a meal. 12 tablet 0  . IBUPROFEN 600 MG PO TABS Oral Take 1 tablet (600 mg total) by mouth every 8 (eight) hours as needed for pain. 30 tablet 0  . OXYCODONE HCL 5 MG PO TABS Oral Take 1 tablet (5 mg total) by mouth every 4 (four) hours as needed for pain. 20 tablet 0  . OXYCODONE HCL 5 MG PO TABS Oral Take 1 tablet (5 mg total) by mouth every 6 (six) hours as needed for pain. 15 tablet 0    BP 152/84  Pulse 104  Temp(Src) 94.5 F (34.7 C) (Oral)  Resp 18  Ht 5\' 9"  (1.753 m)  Wt 160 lb (  72.576 kg)  BMI 23.63 kg/m2  SpO2 100%  Physical Exam  Nursing note and vitals reviewed. Constitutional: He is oriented to person, place, and time. He appears well-developed and well-nourished.  Non-toxic appearance.  HENT:  Head: Normocephalic.  Right Ear: Tympanic membrane and external ear normal.  Left Ear: Tympanic membrane and external ear normal.  Eyes: EOM and lids are normal. Pupils are equal, round, and reactive to light.  Neck: Normal range of motion. Neck supple. Carotid bruit is not present.  Cardiovascular: Normal rate, regular rhythm, normal heart sounds, intact distal pulses and normal pulses.   Pulmonary/Chest: Breath sounds  normal. No respiratory distress.  Abdominal: Soft. Bowel sounds are normal. There is no tenderness. There is no guarding.  Musculoskeletal: Normal range of motion.       Pain to palpation of the left mid to lower back. Soreness with attempted range of motion.  Lymphadenopathy:       Head (right side): No submandibular adenopathy present.       Head (left side): No submandibular adenopathy present.    He has no cervical adenopathy.  Neurological: He is alert and oriented to person, place, and time. He has normal strength. No cranial nerve deficit or sensory deficit. He exhibits normal muscle tone. Coordination normal.  Skin: Skin is warm and dry.  Psychiatric: He has a normal mood and affect. His speech is normal.    ED Course  Procedures (including critical care time)  Labs Reviewed - No data to display No results found. The pulse oximetry 100% on room air. Within normal limits by my interpretation.  1. Lumbar strain       MDM  I have reviewed nursing notes, vital signs, and all appropriate lab and imaging results for this patient. Patient presented with low back pain/strain approximately one week ago. He was treated with narcotic pain medication and muscle relaxers. He went back to work immediately. He has run out of medications at this time and requests to have narcotic medications again. He states that he must have these medications in order to work and he must work. The patient is scheduled for an appointment with a back doctor in [place] on February 1. No gross neurologic deficits appreciated at this time. A prescription for Roxicet and dexamethasone given to the patient.       Kathie Dike, [place] 07/18/11 2019

## 2011-07-19 NOTE — ED Provider Notes (Signed)
Medical screening examination/treatment/procedure(s) were performed by non-physician practitioner and as supervising physician I was immediately available for consultation/collaboration.   Foch Rosenwald L Keanon Bevins, MD 07/19/11 1536 

## 2011-07-22 ENCOUNTER — Encounter (HOSPITAL_COMMUNITY): Payer: Self-pay | Admitting: Emergency Medicine

## 2011-07-22 ENCOUNTER — Emergency Department (HOSPITAL_COMMUNITY)
Admission: EM | Admit: 2011-07-22 | Discharge: 2011-07-23 | Disposition: A | Discharge status: Home / Self Care | Payer: Self-pay | Attending: Emergency Medicine | Admitting: Emergency Medicine

## 2011-07-22 DIAGNOSIS — F17200 Nicotine dependence, unspecified, uncomplicated: Secondary | ICD-10-CM | POA: Insufficient documentation

## 2011-07-22 DIAGNOSIS — M549 Dorsalgia, unspecified: Secondary | ICD-10-CM | POA: Insufficient documentation

## 2011-07-22 MED ORDER — HYDROMORPHONE HCL PF 1 MG/ML IJ SOLN
1.0000 mg | Freq: Once | INTRAMUSCULAR | Status: DC
  Filled 2011-07-22: qty 1

## 2011-07-22 MED ORDER — OXYCODONE HCL 5 MG PO TABS: 5.0000 mg | ORAL_TABLET | Freq: Three times a day (TID) | ORAL | Status: DC | PRN

## 2011-07-22 MED ORDER — OXYCODONE HCL 5 MG PO TABS: 5.0000 mg | ORAL_TABLET | Freq: Four times a day (QID) | ORAL | Status: DC | PRN

## 2011-07-22 NOTE — ED Notes (Signed)
Asked pt if he had a ride because he was getting narcotic pain medicine and he said "yes, she's out front"   When I told pt she needed to be back here for me to see before I could give him the shot he said "Well I have to call her when I am ready or else I can just walk home"   Informed pt that he would need to have a ride present prior to receiving narcotic pain meds.  Dr Estell Harpin made aware

## 2011-07-22 NOTE — ED Notes (Signed)
Pt lifting pallet of concrete at work one week ago and injured mid to lower back.

## 2011-07-22 NOTE — ED Provider Notes (Signed)
History     CSN: [id number]  Arrival date & time 07/22/11  2243   First MD Initiated Contact with Patient 07/22/11 2308      Chief Complaint  Patient presents with  . Back Pain    (Consider location/radiation/quality/duration/timing/severity/associated sxs/prior treatment) Patient is a 31 y.o. male presenting with back pain. The history is provided by the patient (pt states his right upper back pain returned once he ran out of his medicine.  pt has an appointment in 5 days for follow up). No language interpreter was used.  Back Pain  This is a chronic problem. The current episode started more than 1 week ago. The problem occurs constantly. The problem has not changed since onset.The pain is associated with lifting heavy objects. The pain is present in the thoracic spine. The quality of the pain is described as aching. The pain does not radiate. The pain is at a severity of 6/10. The pain is the same all the time. Pertinent negatives include no chest pain, no headaches, no abdominal pain and no abdominal swelling. He has tried nothing for the symptoms. The treatment provided no relief.    Past Medical History  Diagnosis Date  . Cirrhosis   . Back pain     History reviewed. No pertinent past surgical history.  History reviewed. No pertinent family history.  History  Substance Use Topics  . Smoking status: Current Some Day Smoker -- 0.5 packs/day    Types: Cigarettes  . Smokeless tobacco: Not on file  . Alcohol Use: No      Review of Systems  Constitutional: Negative for fatigue.  HENT: Negative for congestion, sinus pressure and ear discharge.   Eyes: Negative for discharge.  Respiratory: Negative for cough.   Cardiovascular: Negative for chest pain.  Gastrointestinal: Negative for abdominal pain and diarrhea.  Genitourinary: Negative for frequency and hematuria.  Musculoskeletal: Positive for back pain.  Skin: Negative for rash.  Neurological: Negative for seizures  and headaches.  Hematological: Negative.   Psychiatric/Behavioral: Negative for hallucinations.    Allergies  Acetaminophen; Darvocet; Hydrocodone; Toradol; and Tramadol  Home Medications   Current Outpatient Rx  Name Route Sig Dispense Refill  . DEXAMETHASONE 4 MG PO TABS Oral Take 1 tablet (4 mg total) by mouth 2 (two) times daily with a meal. 12 tablet 0  . OXYCODONE HCL 5 MG PO TABS Oral Take 1 tablet (5 mg total) by mouth every 6 (six) hours as needed for pain. 15 tablet 0  . CYCLOBENZAPRINE HCL 10 MG PO TABS  Take 1/2 to one tablet 3 times daily. 20 tablet 0  . OXYCODONE HCL 5 MG PO TABS Oral Take 1 tablet (5 mg total) by mouth every 4 (four) hours as needed for pain. 20 tablet 0  . OXYCODONE HCL 5 MG PO TABS Oral Take 1 tablet (5 mg total) by mouth every 6 (six) hours as needed for pain. 30 tablet 0    BP 152/83  Pulse 63  Temp(Src) 98.3 F (36.8 C) (Oral)  Resp 18  Ht 5\' 9"  (1.753 m)  Wt 160 lb (72.576 kg)  BMI 23.63 kg/m2  SpO2 100%  Physical Exam  Constitutional: He is oriented to person, place, and time. He appears well-developed.  HENT:  Head: Normocephalic and atraumatic.  Eyes: Conjunctivae and EOM are normal. No scleral icterus.  Neck: Neck supple. No thyromegaly present.  Cardiovascular: Normal rate and regular rhythm.  Exam reveals no gallop and no friction rub.   No  murmur heard. Pulmonary/Chest: No stridor. He has no wheezes. He has no rales. He exhibits no tenderness.  Abdominal: He exhibits no distension. There is no tenderness. There is no rebound.  Musculoskeletal: He exhibits tenderness. He exhibits no edema.       Tender right upper back  Lymphadenopathy:    He has no cervical adenopathy.  Neurological: He is oriented to person, place, and time. Coordination normal.  Skin: No rash noted. No erythema.  Psychiatric: He has a normal mood and affect. His behavior is normal.    ED Course  Procedures (including critical care time)  Labs Reviewed -  No data to display No results found.   1. Back pain       MDM  Muscle strain        Benny Lennert, MD 07/22/11 2320

## 2011-07-23 NOTE — ED Notes (Signed)
Pt was unable to produce a ride home so was informed that he would not be receiving the narcotic pain med as ordered.

## 2011-07-27 ENCOUNTER — Emergency Department (HOSPITAL_COMMUNITY)
Admission: EM | Admit: 2011-07-27 | Discharge: 2011-07-27 | Disposition: A | Discharge status: Home / Self Care | Payer: Self-pay | Attending: Emergency Medicine | Admitting: Emergency Medicine

## 2011-07-27 ENCOUNTER — Encounter (HOSPITAL_COMMUNITY): Payer: Self-pay | Admitting: *Deleted

## 2011-07-27 DIAGNOSIS — S39012A Strain of muscle, fascia and tendon of lower back, initial encounter: Secondary | ICD-10-CM

## 2011-07-27 DIAGNOSIS — S335XXA Sprain of ligaments of lumbar spine, initial encounter: Secondary | ICD-10-CM | POA: Insufficient documentation

## 2011-07-27 DIAGNOSIS — X500XXA Overexertion from strenuous movement or load, initial encounter: Secondary | ICD-10-CM | POA: Insufficient documentation

## 2011-07-27 DIAGNOSIS — F17200 Nicotine dependence, unspecified, uncomplicated: Secondary | ICD-10-CM | POA: Insufficient documentation

## 2011-07-27 DIAGNOSIS — G8929 Other chronic pain: Secondary | ICD-10-CM | POA: Insufficient documentation

## 2011-07-27 MED ORDER — OXYCODONE HCL 5 MG PO TABS: 5.0000 mg | ORAL_TABLET | ORAL | Status: AC | PRN

## 2011-07-27 MED ORDER — OXYCODONE HCL 5 MG PO TABS
10.0000 mg | ORAL_TABLET | Freq: Once | ORAL | Status: AC
  Administered 2011-07-27: 10 mg via ORAL
  Filled 2011-07-27: qty 2

## 2011-07-27 NOTE — ED Provider Notes (Signed)
History     CSN: [id number]  Arrival date & time 07/27/11  2054   First MD Initiated Contact with Patient 07/27/11 2156      Chief Complaint  Patient presents with  . Back Pain    (Consider location/radiation/quality/duration/timing/severity/associated sxs/prior treatment) HPI Comments: Injured back several weeks ago lifting a pallet with several bags of concrete on it with a co-worker.  States he has an appt with an orthopedist in [place] in 5 days.  Can't take tylenol "because of my liver".  Patient is a 31 y.o. male presenting with back pain. The history is provided by the patient. No language interpreter was used.  Back Pain  This is a chronic problem. The problem occurs constantly. The problem has not changed since onset.The pain is associated with lifting heavy objects. The pain is present in the lumbar spine. The pain is severe. The symptoms are aggravated by bending, twisting and certain positions. He has tried analgesics for the symptoms.    Past Medical History  Diagnosis Date  . Cirrhosis   . Back pain     History reviewed. No pertinent past surgical history.  History reviewed. No pertinent family history.  History  Substance Use Topics  . Smoking status: Current Some Day Smoker -- 0.5 packs/day    Types: Cigarettes  . Smokeless tobacco: Not on file  . Alcohol Use: No      Review of Systems  Musculoskeletal: Positive for back pain.  All other systems reviewed and are negative.    Allergies  Acetaminophen; Darvocet; Hydrocodone; Toradol; and Tramadol  Home Medications  No current outpatient prescriptions on file.  BP 141/100  Pulse 119  Temp(Src) 98.1 F (36.7 C) (Oral)  Resp 20  Ht 5\' 9"  (1.753 m)  Wt 160 lb (72.576 kg)  BMI 23.63 kg/m2  SpO2 99%  Physical Exam  Nursing note and vitals reviewed. Constitutional: He is oriented to person, place, and time. He appears well-developed and well-nourished.  HENT:  Head: Normocephalic and atraumatic.    Eyes: EOM are normal.  Neck: Normal range of motion.  Cardiovascular: Normal rate, regular rhythm, normal heart sounds and intact distal pulses.   Pulmonary/Chest: Effort normal and breath sounds normal. No respiratory distress.  Abdominal: Soft. He exhibits no distension. There is no tenderness.  Musculoskeletal:       Lumbar back: He exhibits decreased range of motion, tenderness and pain. He exhibits no bony tenderness.       Back:  Neurological: He is alert and oriented to person, place, and time.  Skin: Skin is warm and dry.  Psychiatric: He has a normal mood and affect. Judgment normal.    ED Course  Procedures (including critical care time)  Labs Reviewed - No data to display No results found.   No diagnosis found.    MDM          Worthy Rancher, PA 07/27/11 2241

## 2011-07-27 NOTE — ED Notes (Signed)
Low back pain  For 1 week,twisted back when lifting heavy load.

## 2011-07-27 NOTE — ED Notes (Signed)
Pt advised that he is not able to drive, pt states that he has someone picking him up

## 2011-07-28 ENCOUNTER — Ambulatory Visit (INDEPENDENT_AMBULATORY_CARE_PROVIDER_SITE_OTHER): Payer: Self-pay | Admitting: Orthopedic Surgery

## 2011-07-28 ENCOUNTER — Encounter: Payer: Self-pay | Admitting: Orthopedic Surgery

## 2011-07-28 VITALS — BP 140/90 | Ht 69.0 in | Wt 165.0 lb

## 2011-07-28 DIAGNOSIS — S72413A Displaced unspecified condyle fracture of lower end of unspecified femur, initial encounter for closed fracture: Secondary | ICD-10-CM

## 2011-07-28 MED ORDER — HYDROCODONE-ACETAMINOPHEN 5-325 MG PO TABS: 1.0000 | ORAL_TABLET | ORAL | Status: DC | PRN

## 2011-07-28 NOTE — Patient Instructions (Signed)
CT SCAN RIGHT KNEE
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
## 2011-07-29 NOTE — ED Provider Notes (Signed)
Medical screening examination/treatment/procedure(s) were performed by non-physician practitioner and as supervising physician I was immediately available for consultation/collaboration.   Benny Lennert, MD 07/29/11 [phone]

## 2011-07-30 NOTE — Telephone Encounter (Signed)
Patient has been for office visit, see February 2013 notes.

## 2011-08-04 ENCOUNTER — Ambulatory Visit (HOSPITAL_COMMUNITY)
Admission: RE | Admit: 2011-08-04 | Discharge: 2011-08-04 | Disposition: A | Discharge status: Home / Self Care | Payer: Self-pay | Source: Ambulatory Visit | Attending: Orthopedic Surgery | Admitting: Orthopedic Surgery

## 2011-08-04 DIAGNOSIS — M25469 Effusion, unspecified knee: Secondary | ICD-10-CM | POA: Insufficient documentation

## 2011-08-04 DIAGNOSIS — S7290XD Unspecified fracture of unspecified femur, subsequent encounter for closed fracture with routine healing: Secondary | ICD-10-CM | POA: Insufficient documentation

## 2011-08-04 DIAGNOSIS — M79609 Pain in unspecified limb: Secondary | ICD-10-CM | POA: Insufficient documentation

## 2011-08-04 DIAGNOSIS — S72413A Displaced unspecified condyle fracture of lower end of unspecified femur, initial encounter for closed fracture: Secondary | ICD-10-CM

## 2011-08-05 ENCOUNTER — Encounter (HOSPITAL_COMMUNITY): Payer: Self-pay | Admitting: *Deleted

## 2011-08-05 ENCOUNTER — Emergency Department (HOSPITAL_COMMUNITY)
Admission: EM | Admit: 2011-08-05 | Discharge: 2011-08-05 | Disposition: A | Discharge status: Home / Self Care | Payer: Self-pay | Attending: Emergency Medicine | Admitting: Emergency Medicine

## 2011-08-05 DIAGNOSIS — G8929 Other chronic pain: Secondary | ICD-10-CM | POA: Insufficient documentation

## 2011-08-05 DIAGNOSIS — F17200 Nicotine dependence, unspecified, uncomplicated: Secondary | ICD-10-CM | POA: Insufficient documentation

## 2011-08-05 DIAGNOSIS — M549 Dorsalgia, unspecified: Secondary | ICD-10-CM | POA: Insufficient documentation

## 2011-08-05 DIAGNOSIS — F411 Generalized anxiety disorder: Secondary | ICD-10-CM | POA: Insufficient documentation

## 2011-08-05 MED ORDER — DIAZEPAM 5 MG PO TABS: ORAL_TABLET | ORAL | Status: DC

## 2011-08-05 MED ORDER — DIAZEPAM 5 MG PO TABS
5.0000 mg | ORAL_TABLET | Freq: Once | ORAL | Status: AC
  Administered 2011-08-05: 5 mg via ORAL
  Filled 2011-08-05: qty 1

## 2011-08-05 NOTE — ED Provider Notes (Signed)
History     CSN: [id number]  Arrival date & time 08/05/11  2146   None     Chief Complaint  Patient presents with  . Back Pain    (Consider location/radiation/quality/duration/timing/severity/associated sxs/prior treatment) HPI Comments: Patient states he injured his back approximately 1-1-1/2 weeks ago. He states that he has had repeated injuries and problems with his back. He states that he has an appointment with the orthopedic physician in 6 days. He has run out of the oxycodone pain medication and was given to him when he was in the emergency department approximately 1-1/2 weeks ago. He states that this medication helps him work through the pain. He ate knowledge is that he is aware that he is not to work taking narcotic medication. He father states that his boss man has told him that if he misses work that he could lose his job. The patient presents now for a refill of his narcotic pain medication. There has been no new injury to the lower back. The patient denies loss of bowel or bladder function. There's been no new falls or complications with walking.  The history is provided by the patient.    Past Medical History  Diagnosis Date  . Cirrhosis   . Back pain     History reviewed. No pertinent past surgical history.  No family history on file.  History  Substance Use Topics  . Smoking status: Current Some Day Smoker -- 0.5 packs/day    Types: Cigarettes  . Smokeless tobacco: Not on file  . Alcohol Use: No      Review of Systems  Constitutional: Negative for activity change.       All ROS Neg except as noted in HPI  HENT: Negative for nosebleeds and neck pain.   Eyes: Negative for photophobia and discharge.  Respiratory: Negative for cough, shortness of breath and wheezing.   Cardiovascular: Negative for chest pain and palpitations.  Gastrointestinal: Negative for abdominal pain and blood in stool.  Genitourinary: Negative for dysuria, frequency and hematuria.    Musculoskeletal: Positive for back pain. Negative for arthralgias.  Skin: Negative.   Neurological: Negative for dizziness, seizures and speech difficulty.  Psychiatric/Behavioral: Negative for hallucinations and confusion. The patient is nervous/anxious.     Allergies  Acetaminophen; Darvocet; Hydrocodone; Toradol; and Tramadol  Home Medications   Current Outpatient Rx  Name Route Sig Dispense Refill  . OXYCODONE HCL 5 MG PO TABS Oral Take 1 tablet (5 mg total) by mouth every 4 (four) hours as needed for pain. 20 tablet 0    BP 129/84  Pulse 110  Temp 98.6 F (37 C)  Resp 22  Ht 5\' 9"  (1.753 m)  Wt 165 lb (74.844 kg)  BMI 24.37 kg/m2  SpO2 99%  Physical Exam  Nursing note and vitals reviewed. Constitutional: He is oriented to person, place, and time. He appears well-developed and well-nourished.  Non-toxic appearance.  HENT:  Head: Normocephalic.  Right Ear: Tympanic membrane and external ear normal.  Left Ear: Tympanic membrane and external ear normal.  Eyes: EOM and lids are normal. Pupils are equal, round, and reactive to light.  Neck: Normal range of motion. Neck supple. Carotid bruit is not present.  Cardiovascular: Normal rate, regular rhythm, normal heart sounds, intact distal pulses and normal pulses.   Pulmonary/Chest: Breath sounds normal. No respiratory distress.  Abdominal: Soft. Bowel sounds are normal. There is no tenderness. There is no guarding.  Musculoskeletal: Normal range of motion.  Pain to palpation of the lumbar spine area.. There is no palpable deformity appreciated. No gross neurologic deficits appreciated.  Lymphadenopathy:       Head (right side): No submandibular adenopathy present.       Head (left side): No submandibular adenopathy present.    He has no cervical adenopathy.  Neurological: He is alert and oriented to person, place, and time. He has normal strength. No cranial nerve deficit or sensory deficit. He exhibits normal muscle  tone. Coordination normal.  Skin: Skin is warm and dry.  Psychiatric: He has a normal mood and affect. His speech is normal.    ED Course  Procedures (including critical care time)  Labs Reviewed - No data to display Ct Knee Right Wo Contrast  08/04/2011  *RADIOLOGY REPORT*  Clinical Data: Follow up knee fracture.  Continued knee pain.  CT OF THE RIGHT KNEE WITHOUT CONTRAST  Technique:  Multidetector CT imaging was performed according to the standard protocol. Multiplanar CT image reconstructions were also generated.  Comparison: CT 04/24/2011.  04/13/2011 CT.  Knee radiographs 06/19/2011.  Findings: The patella is located.  Alignment of the knee is anatomic.  The largest fracture fragment (36 mm x 18 mm x 6 mm) from the comminuted impaction fracture of the anterior nonweight bearing lateral femoral condyle shows circumferential cortication and there may be a tiny amount of bridging bone fixing this fragment to the adjacent lateral femoral condyle. The fragment remains lateral to the femoral condyle. Superior to the large lateral fracture fragment, there is a small linear calcification, likely representing a cortical shard.   Healing of the lateral femoral condyle impaction fracture is present with developing cortex.  6 mm loose body is present in the medial patellofemoral recess.  There is a moderate knee effusion.  The extensor mechanism appears intact.  The patella appears within normal limits.  There is deficiency of the inferior lateral trochlear sulcus associated with the impaction fracture which could be associated with patellofemoral instability in extension.  Soft tissues appear within normal limits aside from the knee effusion.  Small bone fragment in the anterior medial tibial plateau has united with the plateau.  IMPRESSION: 1.  Healing anterior lateral femoral condyle impaction fracture. The largest bone fragment in the lateral patellofemoral recess appears to be uniting with the lateral  aspect of the lateral femoral condyle. 2.  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 3.  6 mm loose body in the medial patellofemoral recess. 4.  Moderate knee effusion.  Original Report Authenticated By: Andreas Newport, M.D.     No diagnosis found.    MDM  I have reviewed nursing notes, vital signs, and all appropriate lab and imaging results for this patient. I have reviewed the old records for this patient and they reveal multiple emergency department visits for" back pain". They involve a history of patient stating that he would be seen by an orthopedic physician in [place], but I find no record in the Epic concerning this. The patient has received multiple prescriptions for narcotic pain medication, particularly oxycodone IR. I discussed with the patient my concern for potential of prescription narcotic addictions. Suggested an alternative of using muscle relaxants and anti-inflammatory medications. The patient states that he is not comfortable taking anti-inflammatory medications because of problems with his liver and the stomach. The patient goes on to say that he is not comfortable with taking muscle relaxant medications because they make him sleepy during the day and he cannot do his  job. Prescription for Valium 5 mg given to the patient with suggestion that he use it at bedtime. Patient is to see the orthopedic surgeon in 6 days.       Kathie Dike, [place] 08/06/11 1553

## 2011-08-05 NOTE — ED Notes (Signed)
Remains sitting up in bed. No distress. Denies needs. Awaiting to be seen. Call bell within reach. Will continue to monitor.

## 2011-08-05 NOTE — ED Notes (Signed)
MD at bedside to evaluate.

## 2011-08-05 NOTE — Discharge Instructions (Signed)
PLEASE SEE DR HARRISON AS SCHEDULED FOR EVALUATION OF YOUR BACK. YOUR RECORDS INDICATE MULTIPLE EMERGENCY DEPARTMENT VISITS FOR PAIN RELATED COMPLAINTS. PLEASE SEE YOUR MD OR SPECIALIST FOR PAIN MANAGEMENT. Pain Management, Chronic You have a painful condition that has required frequent use of narcotic-type pain medicine. We would like to see that you receive the best possible care for your problem. To achieve this, you must have a personal physician who can supervise a treatment plan for you. You may locate a personal physician on your own or contact one of the doctors whose name has been given to you. If your physician determines that you need to visit the Emergency Department for pain control, that doctor should provide you with a pain contract. This is a letter from your doctor which describes what pain medicine you may receive, how much and how often. You sign it agreeing to the terms of the treatment plan. Bring this with each time you come to this facility. It will help the Emergency Physician provide the proper treatment for you with minimal delay. Please Note: In the future you may not be able to receive narcotic pain medicine from this facility without a pain contract or telephone approval from your personal physician. Document Released: 01/29/2004 Document Revised: 02/17/2011 Document Reviewed: 06/03/2008 [facility] Patient Information 2012 [place], [place].

## 2011-08-05 NOTE — ED Notes (Signed)
Patient report given to this nurse. Assuming care of patient. Resting sitting up in bed. Denies any needs at this time. Equal chest rise and fall. No distress.

## 2011-08-05 NOTE — ED Notes (Signed)
Low back pain,  No injury

## 2011-08-06 NOTE — ED Provider Notes (Signed)
Medical screening examination/treatment/procedure(s) were performed by non-physician practitioner and as supervising physician I was immediately available for consultation/collaboration.   Hanley Seamen, MD 08/06/11 2240

## 2011-08-11 ENCOUNTER — Encounter: Payer: Self-pay | Admitting: Orthopedic Surgery

## 2011-08-11 ENCOUNTER — Ambulatory Visit (INDEPENDENT_AMBULATORY_CARE_PROVIDER_SITE_OTHER): Payer: Self-pay | Admitting: Orthopedic Surgery

## 2011-08-11 VITALS — BP 110/70 | Ht 69.0 in | Wt 165.0 lb

## 2011-08-11 DIAGNOSIS — S72409A Unspecified fracture of lower end of unspecified femur, initial encounter for closed fracture: Secondary | ICD-10-CM

## 2011-08-11 DIAGNOSIS — S72401A Unspecified fracture of lower end of right femur, initial encounter for closed fracture: Secondary | ICD-10-CM

## 2011-08-11 MED ORDER — HYDROCODONE-ACETAMINOPHEN 5-325 MG PO TABS: 1.0000 | ORAL_TABLET | Freq: Four times a day (QID) | ORAL | Status: DC | PRN

## 2011-08-11 NOTE — Patient Instructions (Addendum)
Call office when hospital approves [facility] discount to schedule surgery right knee arthroscopy removal of loose body Arthroscopic Procedure, Knee An arthroscopic procedure can find what is wrong with your knee. PROCEDURE Arthroscopy is a surgical technique that allows your orthopedic surgeon to diagnose and treat your knee injury with accuracy. They will look into your knee through a small instrument. This is almost like a small (pencil sized) telescope. Because arthroscopy affects your knee less than open knee surgery, you can anticipate a more rapid recovery. Taking an active role by following your caregiver's instructions will help with rapid and complete recovery. Use crutches, rest, elevation, ice, and knee exercises as instructed. The length of recovery depends on various factors including type of injury, age, physical condition, medical conditions, and your rehabilitation. Your knee is the joint between the large bones (femur and tibia) in your leg. Cartilage covers these bone ends which are smooth and slippery and allow your knee to bend and move smoothly. Two menisci, thick, semi-lunar shaped pads of cartilage which form a rim inside the joint, help absorb shock and stabilize your knee. Ligaments bind the bones together and support your knee joint. Muscles move the joint, help support your knee, and take stress off the joint itself. Because of this all programs and physical therapy to rehabilitate an injured or repaired knee require rebuilding and strengthening your muscles. AFTER THE PROCEDURE  After the procedure, you will be moved to a recovery area until most of the effects of the medication have worn off. Your caregiver will discuss the test results with you.     Only take over-the-counter or prescription medicines for pain, discomfort, or fever as directed by your caregiver.  SEEK MEDICAL CARE IF:    You have increased bleeding from your wounds.     You see redness, swelling, or have  increasing pain in your wounds.     You have pus coming from your wound.     You have an oral temperature above 102 F (38.9 C).     You notice a bad smell coming from the wound or dressing.     You have severe pain with any motion of your knee.  SEEK IMMEDIATE MEDICAL CARE IF:    You develop a rash.     You have difficulty breathing.     You have any allergic problems.  Document Released: 06/04/2000 Document Revised: 02/17/2011 Document Reviewed: 12/27/2007 [facility] Patient Information 2012 [place], [place].

## 2011-08-11 NOTE — Progress Notes (Signed)
Patient ID: Roy [name], male   DOB: October 10, 1980, 31 y.o.   MRN: [id number] Chief Complaint  Patient presents with  . Follow-up    CT scan results of right knee.   ROS: back pain chronic went to ER has giving way symptoms of the knee   BP 110/70  Ht 5\' 9"  (1.753 m)  Wt 165 lb (74.844 kg)  BMI 24.37 kg/m2  CT review and report review   CT Scan shows a large bone fragment lateral to the condyle, which we will need to remove.   IMPRESSION:  1. Healing anterior lateral femoral condyle impaction fracture.  The largest bone fragment in the lateral patellofemoral recess  appears to be uniting with the lateral aspect of the lateral  femoral condyle.  2. Deficiency of the inferior lateral trochlea with post-traumatic  deformity.  3. 6 mm loose body in the medial patellofemoral recess.  4. Moderate knee effusion.  I discussed the findings with the patient and I also discussed his use of narcotic pain medications. Once he gets approval for discount he will call us back to have surgery scheduled for removal of loose body and arthroscopic knee evaluation.

## 2011-08-14 ENCOUNTER — Emergency Department (HOSPITAL_COMMUNITY)
Admission: EM | Admit: 2011-08-14 | Discharge: 2011-08-14 | Disposition: A | Discharge status: Home / Self Care | Payer: Self-pay | Attending: Emergency Medicine | Admitting: Emergency Medicine

## 2011-08-14 ENCOUNTER — Encounter (HOSPITAL_COMMUNITY): Payer: Self-pay

## 2011-08-14 DIAGNOSIS — S335XXA Sprain of ligaments of lumbar spine, initial encounter: Secondary | ICD-10-CM | POA: Insufficient documentation

## 2011-08-14 DIAGNOSIS — M549 Dorsalgia, unspecified: Secondary | ICD-10-CM | POA: Insufficient documentation

## 2011-08-14 DIAGNOSIS — S39012A Strain of muscle, fascia and tendon of lower back, initial encounter: Secondary | ICD-10-CM

## 2011-08-14 DIAGNOSIS — X500XXA Overexertion from strenuous movement or load, initial encounter: Secondary | ICD-10-CM | POA: Insufficient documentation

## 2011-08-14 MED ORDER — OXYCODONE HCL 5 MG PO TABS
5.0000 mg | ORAL_TABLET | Freq: Once | ORAL | Status: AC
  Administered 2011-08-14: 5 mg via ORAL
  Filled 2011-08-14: qty 1

## 2011-08-14 MED ORDER — OXYCODONE HCL 5 MG PO TABS: ORAL_TABLET | ORAL | Status: DC

## 2011-08-14 NOTE — ED Provider Notes (Signed)
History     CSN: [id number]  Arrival date & time 08/14/11  1925   First MD Initiated Contact with Patient 08/14/11 2036      Chief Complaint  Patient presents with  . Back Pain    (Consider location/radiation/quality/duration/timing/severity/associated sxs/prior treatment) HPI Comments: Injured L lower back picking up a piece of concrete yest.  Saw dr. 3 days ago for knee pain and was written rx for #42 hydrocodone.  States he has not filled it b/c he can't take tylenol secondary to liver cirrhosis.  Patient is a 31 y.o. male presenting with back pain. The history is provided by the patient. No language interpreter was used.  Back Pain  The current episode started yesterday. The problem occurs constantly. The pain is associated with lifting heavy objects. The pain is present in the lumbar spine. The quality of the pain is described as aching. The pain does not radiate. The pain is severe. The symptoms are aggravated by bending and certain positions. The pain is the same all the time. He has tried nothing for the symptoms.    Past Medical History  Diagnosis Date  . Cirrhosis   . Back pain     History reviewed. No pertinent past surgical history.  No family history on file.  History  Substance Use Topics  . Smoking status: Current Some Day Smoker -- 0.5 packs/day    Types: Cigarettes  . Smokeless tobacco: Not on file  . Alcohol Use: No      Review of Systems  Musculoskeletal: Positive for back pain.  All other systems reviewed and are negative.    Allergies  Acetaminophen; Darvocet; Hydrocodone; Toradol; and Tramadol  Home Medications   Current Outpatient Rx  Name Route Sig Dispense Refill  . DIAZEPAM 5 MG PO TABS  1 at hs for spasm/pain 10 tablet 0  . HYDROCODONE-ACETAMINOPHEN 5-325 MG PO TABS Oral Take 1 tablet by mouth every 6 (six) hours as needed. 42 tablet 2    BP 144/81  Pulse 110  Temp(Src) 98.1 F (36.7 C) (Oral)  Resp 16  Ht 5\' 9"  (1.753 m)  Wt  165 lb (74.844 kg)  BMI 24.37 kg/m2  SpO2 99%  Physical Exam  Nursing note and vitals reviewed. Constitutional: He is oriented to person, place, and time. He appears well-developed and well-nourished.  HENT:  Head: Normocephalic and atraumatic.  Eyes: EOM are normal.  Neck: Normal range of motion.  Cardiovascular: Normal rate, regular rhythm, normal heart sounds and intact distal pulses.   Pulmonary/Chest: Effort normal and breath sounds normal. No respiratory distress.  Abdominal: Soft. He exhibits no distension. There is no tenderness.  Musculoskeletal:       Lumbar back: He exhibits decreased range of motion, tenderness and pain. He exhibits no bony tenderness, no swelling, no edema, no deformity, no laceration and no spasm.       Arms: Neurological: He is alert and oriented to person, place, and time.  Skin: Skin is warm and dry.  Psychiatric: He has a normal mood and affect. Judgment normal.    ED Course  Procedures (including critical care time)  Labs Reviewed - No data to display No results found.   No diagnosis found.    MDM          Worthy Rancher, PA 08/14/11 2127

## 2011-08-14 NOTE — ED Notes (Signed)
MD at bedside. 

## 2011-08-14 NOTE — ED Notes (Signed)
Pt DC to home with steady gait 

## 2011-08-14 NOTE — Discharge Instructions (Signed)
Back Exercises Back exercises help treat and prevent back injuries. The goal of back exercises is to increase the strength of your abdominal and back muscles and the flexibility of your back. These exercises should be started when you no longer have back pain. Back exercises include:  Pelvic Tilt. Lie on your back with your knees bent. Tilt your pelvis until the lower part of your back is against the floor. Hold this position 5 to 10 sec and repeat 5 to 10 times.   Knee to Chest. Pull first 1 knee up against your chest and hold for 20 to 30 seconds, repeat this with the other knee, and then both knees. This may be done with the other leg straight or bent, whichever feels better.   Sit-Ups or Curl-Ups. Bend your knees 90 degrees. Start with tilting your pelvis, and do a partial, slow sit-up, lifting your trunk only 30 to 45 degrees off the floor. Take at least 2 to 3 seconds for each sit-up. Do not do sit-ups with your knees out straight. If partial sit-ups are difficult, simply do the above but with only tightening your abdominal muscles and holding it as directed.   Hip-Lift. Lie on your back with your knees flexed 90 degrees. Push down with your feet and shoulders as you raise your hips a couple inches off the floor; hold for 10 seconds, repeat 5 to 10 times.   Back arches. Lie on your stomach, propping yourself up on bent elbows. Slowly press on your hands, causing an arch in your low back. Repeat 3 to 5 times. Any initial stiffness and discomfort should lessen with repetition over time.   Shoulder-Lifts. Lie face down with arms beside your body. Keep hips and torso pressed to floor as you slowly lift your head and shoulders off the floor.  Do not overdo your exercises, especially in the beginning. Exercises may cause you some mild back discomfort which lasts for a few minutes; however, if the pain is more severe, or lasts for more than 15 minutes, do not continue exercises until you see your  caregiver. Improvement with exercise therapy for back problems is slow.  See your caregivers for assistance with developing a proper back exercise program. Document Released: 07/15/2004 Document Revised: 02/03/2011 Document Reviewed: 06/07/2005 [facility] Patient Information 2012 [place], [place].   Take the pain medicine as directed.  Follow up with the MD of your choice.  You may consider getting established with a pain management clinic.

## 2011-08-14 NOTE — ED Notes (Signed)
Pt presents with low back pain after "picking up concrete" yesterday at work.

## 2011-08-15 NOTE — ED Provider Notes (Signed)
Medical screening examination/treatment/procedure(s) were performed by non-physician practitioner and as supervising physician I was immediately available for consultation/collaboration.  Ashonti Leandro L Marylin Lathon, MD 08/15/11 0027 

## 2011-08-20 ENCOUNTER — Encounter (HOSPITAL_COMMUNITY): Payer: Self-pay | Admitting: Emergency Medicine

## 2011-08-20 ENCOUNTER — Emergency Department (HOSPITAL_COMMUNITY)
Admission: EM | Admit: 2011-08-20 | Discharge: 2011-08-20 | Disposition: A | Discharge status: Home / Self Care | Payer: Self-pay | Attending: Emergency Medicine | Admitting: Emergency Medicine

## 2011-08-20 DIAGNOSIS — M25569 Pain in unspecified knee: Secondary | ICD-10-CM | POA: Insufficient documentation

## 2011-08-20 DIAGNOSIS — F17200 Nicotine dependence, unspecified, uncomplicated: Secondary | ICD-10-CM | POA: Insufficient documentation

## 2011-08-20 DIAGNOSIS — G8929 Other chronic pain: Secondary | ICD-10-CM | POA: Insufficient documentation

## 2011-08-20 DIAGNOSIS — M2340 Loose body in knee, unspecified knee: Secondary | ICD-10-CM | POA: Insufficient documentation

## 2011-08-20 DIAGNOSIS — K7460 Unspecified cirrhosis of liver: Secondary | ICD-10-CM | POA: Insufficient documentation

## 2011-08-20 HISTORY — DX: Pain in unspecified knee: M25.569

## 2011-08-20 HISTORY — DX: Dorsalgia, unspecified: M54.9

## 2011-08-20 HISTORY — DX: Other chronic pain: G89.29

## 2011-08-20 MED ORDER — OXYCODONE HCL 5 MG PO TABS: 5.0000 mg | ORAL_TABLET | Freq: Four times a day (QID) | ORAL | Status: AC | PRN

## 2011-08-20 NOTE — ED Provider Notes (Signed)
History     CSN: [id number]  Arrival date & time 08/20/11  4098   First MD Initiated Contact with Patient 08/20/11 0246      Chief Complaint  Patient presents with  . Knee Pain     HPI Pt was seen at 0245.  Per pt, c/o gradual onset and persistence of constant acute flair of his chronic right knee "pain" for the past several days.  Pain began after he ran out of his oxycontin.  Pt states he was eval by his Ortho MD several days ago for same, hx vicodin but "can't take that because of my liver."  Pt here requesting oxycontin rx.  Pt denies any change in his usual longstanding chronic pain pattern, no new injury, no rash, no fevers, no joint swelling, no calf pain/unilateral swelling, no focal motor weakness, no tingling/numbness in extremity.  Pt has been eval in the ED multiple times for same and has been recently evaluated for this complaint.      Past Medical History  Diagnosis Date  . Cirrhosis   . Chronic knee pain   . Chronic back pain     History reviewed. No pertinent past surgical history.   History  Substance Use Topics  . Smoking status: Current Some Day Smoker -- 0.5 packs/day    Types: Cigarettes  . Smokeless tobacco: Not on file  . Alcohol Use: No    Review of Systems ROS: Statement: All systems negative except as marked or noted in the HPI; Constitutional: Negative for fever and chills. ; ; Eyes: Negative for eye pain, redness and discharge. ; ; ENMT: Negative for ear pain, hoarseness, nasal congestion, sinus pressure and sore throat. ; ; Cardiovascular: Negative for chest pain, palpitations, diaphoresis, dyspnea and peripheral edema. ; ; Respiratory: Negative for cough, wheezing and stridor. ; ; Gastrointestinal: Negative for nausea, vomiting, diarrhea, abdominal pain, blood in stool, hematemesis, jaundice and rectal bleeding. . ; ; Genitourinary: Negative for dysuria, flank pain and hematuria. ; ; Musculoskeletal: Negative for back pain and neck pain. Negative for  swelling and trauma. +knee pain; ; Skin: Negative for pruritus, rash, abrasions, blisters, bruising and skin lesion.; ; Neuro: Negative for headache, lightheadedness and neck stiffness. Negative for weakness, altered level of consciousness , altered mental status, extremity weakness, paresthesias, involuntary movement, seizure and syncope.     Allergies  Acetaminophen; Darvocet; Hydrocodone; Toradol; and Tramadol  Home Medications   Current Outpatient Rx  Name Route Sig Dispense Refill  . DIAZEPAM 5 MG PO TABS  1 at hs for spasm/pain 10 tablet 0  . HYDROCODONE-ACETAMINOPHEN 5-325 MG PO TABS Oral Take 1 tablet by mouth every 6 (six) hours as needed. 42 tablet 2  . OXYCODONE HCL 5 MG PO TABS  Take one tab po q 6 hrs prn pain 12 tablet 0  . OXYCODONE HCL 5 MG PO TABS Oral Take 1 tablet (5 mg total) by mouth every 6 (six) hours as needed for pain. 12 tablet 0    BP 148/75  Pulse 99  Temp(Src) 98 F (36.7 C) (Oral)  Resp 16  Ht 5\' 9"  (1.753 m)  Wt 165 lb (74.844 kg)  BMI 24.37 kg/m2  SpO2 96%  Physical Exam 0250: Physical examination:  Nursing notes reviewed; Vital signs and O2 SAT reviewed;  Constitutional: Well developed, Well nourished, Well hydrated, In no acute distress; Head:  Normocephalic, atraumatic; Eyes: EOMI, PERRL, No scleral icterus; ENMT: Mouth and pharynx normal, Mucous membranes moist; Neck: Supple, Full range of  motion, No lymphadenopathy; Cardiovascular: Regular rate and rhythm, No murmur, rub, or gallop; Respiratory: Breath sounds clear & equal bilaterally, No rales, rhonchi, wheezes, or rub, Normal respiratory effort/excursion; Chest: Nontender, Movement normal; Abdomen: Soft, Nontender, Nondistended, Normal bowel sounds; Genitourinary: No CVA tenderness; Extremities: Pulses normal, +FROM right knee, including able to lift extended RLE off stretcher, and extend right lower leg against resistance.  No ligamentous laxity.  No patellar or quad tendon step-offs.  NMS intact  right foot, strong pedal pp.  No proximal fibular head tenderness.  No edema, erythema, warmth.  +generalized TTP without specific area of point tenderness, No edema, No calf edema or asymmetry.; Neuro: AA&Ox3, Major CN grossly intact.  No gross focal motor or sensory deficits in extremities.; Skin: Color normal, Warm, Dry, no rash.    ED Course  Procedures  MDM  MDM Reviewed: nursing note, vitals and previous chart Reviewed previous: CT scan    Ct Knee Right Wo Contrast 08/04/2011  *RADIOLOGY REPORT*  Clinical Data: Follow up knee fracture.  Continued knee pain.  CT OF THE RIGHT KNEE WITHOUT CONTRAST  Technique:  Multidetector CT imaging was performed according to the standard protocol. Multiplanar CT image reconstructions were also generated.  Comparison: CT 04/24/2011.  04/13/2011 CT.  Knee radiographs 06/19/2011.  Findings: The patella is located.  Alignment of the knee is anatomic.  The largest fracture fragment (36 mm x 18 mm x 6 mm) from the comminuted impaction fracture of the anterior nonweight bearing lateral femoral condyle shows circumferential cortication and there may be a tiny amount of bridging bone fixing this fragment to the adjacent lateral femoral condyle. The fragment remains lateral to the femoral condyle. Superior to the large lateral fracture fragment, there is a small linear calcification, likely representing a cortical shard.   Healing of the lateral femoral condyle impaction fracture is present with developing cortex.  6 mm loose body is present in the medial patellofemoral recess.  There is a moderate knee effusion.  The extensor mechanism appears intact.  The patella appears within normal limits.  There is deficiency of the inferior lateral trochlear sulcus associated with the impaction fracture which could be associated with patellofemoral instability in extension.  Soft tissues appear within normal limits aside from the knee effusion.  Small bone fragment in the anterior  medial tibial plateau has united with the plateau.  IMPRESSION: 1.  Healing anterior lateral femoral condyle impaction fracture. The largest bone fragment in the lateral patellofemoral recess appears to be uniting with the lateral aspect of the lateral femoral condyle. 2.  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 3.  6 mm loose body in the medial patellofemoral recess. 4.  Moderate knee effusion.  Original Report Authenticated By: Andreas Newport, M.D.        3:03 AM:  Pt was just eval by his Ortho MD for same complaint and above CT scan on 08/11/2011; apparently is to have surgery on his knee to remove loose body but is "waiting for approval."  Pt with multiple ED visits for chronic pain.  States he "can't take" the vicodin that Dr. Romeo Apple has rx him "because of my liver."  Requesting oxy IR.  Long d/w pt regarding finding a pain management specialist or PMD to assist him with his chronic pain needs and provide him with continuity of care.  Verb understanding.         Laray Anger, DO 08/20/11 1533

## 2011-08-20 NOTE — ED Notes (Signed)
Patient states that his right knee is bothering him. States he stepped in a hole today and it is bothering him.

## 2011-08-20 NOTE — Discharge Instructions (Signed)
RESOURCE GUIDE  Dental Problems  Patients with Medicaid: [facility]                     [facility] [address]                                           [address] Phone:  [phone]                                                  Phone:  [phone]  If unable to pay or uninsured, contact:  [facility] or [facility] to become qualified for the adult dental clinic.  Chronic Pain Problems Contact [facility]  [phone] Patients need to be referred by their primary care doctor.  Insufficient Money for Medicine Contact United Way:  call "211" or [facility] 271-5999.  No Primary Care Doctor Call [facility]  [phone] Other agencies that provide inexpensive medical care    [facility]  [phone]    [facility]  [phone]    [facility]  [phone]    Women's Clinic  [phone]    [facility]  [phone]    [facility]  [phone]  Psychological Services [facility]  [phone] [facility]  [phone] [facility]   [phone] (emergency services [phone])  Substance Abuse Resources [facility]  336-882-2125 [facility] 336-784-9470 [facility] 336-285-9073 [facility] 336-845-3988 Residential & Outpatient Substance Abuse Program  800-659-3381  Abuse/Neglect [place] Child Abuse Hotline (336) 641-3795 [place] Child Abuse Hotline 800-378-5315 (After Hours)  Emergency Shelter [facility] (336) 271-5985  [facility] (336) 275-9566 [facility] (704) 372-4663  MRSA Hotline #:   832-7006    [place] Resources  [facility] [place]     [employer] Way                          [facility] [address] [place]                       [address]      [address]  [place]                                                [place]                            [place] Phone:  [phone]                                   Phone:  [phone]                 Phone:  [phone]  [facility] Phone:  [phone]    [facility] Child Abuse Hotline (726) 409-7402 (289)033-3824 (After Hours)    Take the prescription as directed.  Apply moist heat or ice to the area(s) of discomfort, for 15 minutes at a time, several times per day for the next few days.  Do not fall asleep on a heating or ice pack.  Call your regular medical doctor, your Orthopedist and the Pain Management doctor this morning to schedule a follow up appointment this week.  Return to the Emergency Department immediately if worsening.

## 2011-08-25 ENCOUNTER — Encounter (HOSPITAL_COMMUNITY): Payer: Self-pay

## 2011-08-25 ENCOUNTER — Emergency Department (HOSPITAL_COMMUNITY)
Admission: EM | Admit: 2011-08-25 | Discharge: 2011-08-25 | Disposition: A | Discharge status: Home / Self Care | Payer: Self-pay | Attending: Emergency Medicine | Admitting: Emergency Medicine

## 2011-08-25 DIAGNOSIS — K089 Disorder of teeth and supporting structures, unspecified: Secondary | ICD-10-CM | POA: Insufficient documentation

## 2011-08-25 DIAGNOSIS — K7460 Unspecified cirrhosis of liver: Secondary | ICD-10-CM | POA: Insufficient documentation

## 2011-08-25 DIAGNOSIS — Z765 Malingerer [conscious simulation]: Secondary | ICD-10-CM | POA: Insufficient documentation

## 2011-08-25 DIAGNOSIS — G8929 Other chronic pain: Secondary | ICD-10-CM | POA: Insufficient documentation

## 2011-08-25 DIAGNOSIS — K0889 Other specified disorders of teeth and supporting structures: Secondary | ICD-10-CM

## 2011-08-25 DIAGNOSIS — K029 Dental caries, unspecified: Secondary | ICD-10-CM | POA: Insufficient documentation

## 2011-08-25 DIAGNOSIS — M25569 Pain in unspecified knee: Secondary | ICD-10-CM | POA: Insufficient documentation

## 2011-08-25 DIAGNOSIS — F17200 Nicotine dependence, unspecified, uncomplicated: Secondary | ICD-10-CM | POA: Insufficient documentation

## 2011-08-25 DIAGNOSIS — M549 Dorsalgia, unspecified: Secondary | ICD-10-CM | POA: Insufficient documentation

## 2011-08-25 DIAGNOSIS — F1910 Other psychoactive substance abuse, uncomplicated: Secondary | ICD-10-CM | POA: Insufficient documentation

## 2011-08-25 NOTE — Discharge Instructions (Signed)
See the dentist of your choice as soon as possible.  RESOURCE GUIDE  Dental Problems  Patients with Medicaid: [facility] [phone]                                           [phone] [address] [employer] [address] Phone:  [phone]                                                  Phone:  [phone]  If unable to pay or uninsured, contact:  [facility] or [facility] to become qualified for the adult dental clinic.  Chronic Pain Problems Contact [facility]  [phone] Patients need to be referred by their primary care doctor.  Insufficient Money for Medicine Contact United Way:  call "211" or [facility] (930)349-3628.  No Primary Care Doctor Call [facility]  662-103-5360 Other agencies that provide inexpensive medical care    [facility]  ([phone]    [facility]  [phone]    [facility]  ([phone]    [facility]  734-261-2875    [facility]  309-859-0796    [facility]  [phone]  Psychological Services [facility]  [phone] [facility] Services  [phone] [facility]   540-734-5039 (emergency services (951) [phone])  Substance Abuse Resources [facility]  (951)709-4768 [facility] 4243223493 [facility] 8153341253 Floyd[facility] (878)697-2145 Residential & Outpatient Substance Abuse Program  9307270214  Abuse/Neglect [facility] Child Abuse Hotline 4455088482 [facility] Child Abuse Hotline (403) 350-6104 (After Hours)  Emergency Shelter [facility] (319)880-0443  M[facility] Homes [facility] [place] [facility] 902-443-7135 [facility] 450-884-4323  MRSA Hotline #:   941 697 0274    [facility] Resources  [facility] [place]     United Way                          [facility] [address]  [address] [place]                       [address]      [address]  [place]                                                [place] Goldmann Phone:  [phone]                                   Phone:  [phone]  Phone:  ([phone]  [facility] Mental [facility] Phone:  [phone]  [facility] Child Abuse Hotline 618-579-9959 332-502-6444 (After Hours) Dental Pain A tooth ache may be caused by cavities (tooth decay). Cavities expose the nerve of the tooth to air and hot or cold temperatures. It may come from an infection or abscess (also called a boil or furuncle) around your tooth. It is also often caused by dental caries (tooth decay). This causes the pain you are having. DIAGNOSIS  Your caregiver can diagnose this problem by exam. TREATMENT   If caused by an infection, it may be treated with medications which kill germs (antibiotics) and pain medications as prescribed by your caregiver. Take medications as directed.   Only take over-the-counter or prescription medicines for pain, discomfort, or fever as directed by your caregiver.   Whether the tooth ache today is caused by infection or dental disease, you should see your dentist as soon as possible for further care.  SEEK MEDICAL CARE IF: The exam and treatment you received today has been provided on an emergency basis only. This is not a substitute for complete medical or dental care. If your problem worsens or new problems (symptoms) appear, and you are unable to meet with your dentist, call or return to this location. SEEK IMMEDIATE MEDICAL CARE IF:   You have a fever.   You develop redness and swelling of your face, jaw, or neck.   You are unable to open your mouth.   You have severe pain uncontrolled by pain medicine.  MAKE SURE YOU:   Understand these instructions.   Will watch your condition.   Will get help right away if you are not doing well or get  worse.  Document Released: 06/07/2005 Document Revised: 05/27/2011 Document Reviewed: 01/24/2008 [facility] Patient Information 2012 [place], [place].

## 2011-08-25 NOTE — ED Notes (Signed)
I have a toothache and my right knee hurts per pt.

## 2011-08-25 NOTE — ED Provider Notes (Signed)
History     CSN: [id number]  Arrival date & time 08/25/11  4401   First MD Initiated Contact with Patient 08/25/11 [phone]      Chief Complaint  Patient presents with  . Dental Pain  . Knee Pain    (Consider location/radiation/quality/duration/timing/severity/associated sxs/prior treatment) HPI Comments: .Roy [name] is a 31 y.o. male who states he has had tooth pain for 2 days. Located to the left. Upper region. He is a frequent visitor to the emergency department for various painful conditions. He states he has 40 clindamycin pills at home. Currently, he states he has no dental care. He was in the emergency department 4 days ago, requesting OxyContin for knee pain. Patient ambulated to the emergency department today.  Patient is a 31 y.o. male presenting with tooth pain and knee pain.  Dental Pain  Knee Pain    Past Medical History  Diagnosis Date  . Cirrhosis   . Chronic knee pain   . Chronic back pain     History reviewed. No pertinent past surgical history.  No family history on file.  History  Substance Use Topics  . Smoking status: Current Some Day Smoker -- 0.5 packs/day    Types: Cigarettes  . Smokeless tobacco: Not on file  . Alcohol Use: No      Review of Systems  Musculoskeletal:       He has right knee pain  All other systems reviewed and are negative.    Allergies  Acetaminophen; Darvocet; Hydrocodone; Toradol; and Tramadol  Home Medications   Current Outpatient Rx  Name Route Sig Dispense Refill  . DIAZEPAM 5 MG PO TABS  1 at hs for spasm/pain 10 tablet 0  . HYDROCODONE-ACETAMINOPHEN 5-325 MG PO TABS Oral Take 1 tablet by mouth every 6 (six) hours as needed. 42 tablet 2  . OXYCODONE HCL 5 MG PO TABS  Take one tab po q 6 hrs prn pain 12 tablet 0  . OXYCODONE HCL 5 MG PO TABS Oral Take 1 tablet (5 mg total) by mouth every 6 (six) hours as needed for pain. 12 tablet 0    BP 132/85  Pulse 86  Temp(Src) 98 F (36.7 C) (Oral)  Resp 20   Ht 5\' 9"  (1.753 m)  Wt 160 lb (72.576 kg)  BMI 23.63 kg/m2  SpO2 96%  Physical Exam  Constitutional: He is oriented to person, place, and time. He appears well-developed and well-nourished.       He is holding his left face  HENT:       He has poor dentition with multiple caries. There are no areas oral swelling. There is no trismus.  Eyes: Conjunctivae and EOM are normal. Pupils are equal, round, and reactive to light.  Neck: Normal range of motion. Neck supple.  Musculoskeletal:       No gross deformity  Neurological: He is alert and oriented to person, place, and time. He has normal reflexes. No cranial nerve deficit. He exhibits normal muscle tone. Coordination normal.       Normal  Skin: Skin is warm and dry.  Psychiatric: His behavior is normal.       He is anxious    ED Course  Procedures (including critical care time)  Labs Reviewed - No data to display No results found.   1. Pain, dental       MDM  Drug-seeking behavior. Patient does not appear to have an acute problem to require either investigation or treatment in  the emergency department. He has been evaluated and is stable for discharge.  Plan: Home  Medications- Advil; Home Treatments- heat to sore area; advised to see a dentist Recommended follow up- Dentist        Flint Melter, MD 08/25/11 [phone]

## 2012-01-05 ENCOUNTER — Encounter (HOSPITAL_COMMUNITY): Payer: Self-pay | Admitting: *Deleted

## 2012-01-05 ENCOUNTER — Emergency Department (HOSPITAL_COMMUNITY)
Admission: EM | Admit: 2012-01-05 | Discharge: 2012-01-06 | Disposition: A | Discharge status: Home / Self Care | Payer: Self-pay | Attending: Emergency Medicine | Admitting: Emergency Medicine

## 2012-01-05 DIAGNOSIS — G8929 Other chronic pain: Secondary | ICD-10-CM | POA: Insufficient documentation

## 2012-01-05 DIAGNOSIS — F17200 Nicotine dependence, unspecified, uncomplicated: Secondary | ICD-10-CM | POA: Insufficient documentation

## 2012-01-05 DIAGNOSIS — S8990XA Unspecified injury of unspecified lower leg, initial encounter: Secondary | ICD-10-CM | POA: Insufficient documentation

## 2012-01-05 DIAGNOSIS — S99919A Unspecified injury of unspecified ankle, initial encounter: Secondary | ICD-10-CM | POA: Insufficient documentation

## 2012-01-05 DIAGNOSIS — M25569 Pain in unspecified knee: Secondary | ICD-10-CM

## 2012-01-05 DIAGNOSIS — W11XXXA Fall on and from ladder, initial encounter: Secondary | ICD-10-CM | POA: Insufficient documentation

## 2012-01-05 MED ORDER — OXYCODONE HCL 5 MG PO TABS
5.0000 mg | ORAL_TABLET | Freq: Once | ORAL | Status: AC
  Administered 2012-01-05: 5 mg via ORAL
  Filled 2012-01-05: qty 1

## 2012-01-05 MED ORDER — IBUPROFEN 800 MG PO TABS
800.0000 mg | ORAL_TABLET | Freq: Once | ORAL | Status: AC
  Administered 2012-01-05: 800 mg via ORAL
  Filled 2012-01-05: qty 1

## 2012-01-05 MED ORDER — OXYCODONE HCL 5 MG PO TABS: 5.0000 mg | ORAL_TABLET | ORAL | Status: AC | PRN

## 2012-01-05 NOTE — ED Provider Notes (Signed)
History   This chart was scribed for [employer]. Colon Branch, MD by Charolett Bumpers . The patient was seen in room APA10/APA10. Patient's care was started at 2300.    CSN: [id number]  Arrival date & time 01/05/12  2200   First MD Initiated Contact with Patient 01/05/12 2300      Chief Complaint  Patient presents with  . Knee Pain    (Consider location/radiation/quality/duration/timing/severity/associated sxs/prior treatment) HPI Roy [name] is a 31 y.o. male who presents to the Emergency Department complaining of constant, moderate right knee pain after falling earlier today. Patient reports that he was on a ladder, slipped and jumped down 4-5 ft, landed on leg and twisted right knee. Pt denies hitting head or LOC. Pt reports that he has been having pain ever since. Pt reports that he has a h/o chronic right knee pain and has been unable to have surgery due to insurance. Pt reports a fracture of the right knee cap in 2012. Pt denies any other complaints of pain at this time.    Orthopedic: Dr. Romeo Apple  Past Medical History  Diagnosis Date  . Cirrhosis   . Chronic knee pain   . Chronic back pain     History reviewed. No pertinent past surgical history.  History reviewed. No pertinent family history.  History  Substance Use Topics  . Smoking status: Current Some Day Smoker -- 0.5 packs/day    Types: Cigarettes  . Smokeless tobacco: Not on file  . Alcohol Use: No      Review of Systems  Constitutional: Negative for fever.       10 Systems reviewed and are negative for acute change except as noted in the HPI.  HENT: Negative for congestion.   Eyes: Negative for discharge and redness.  Respiratory: Negative for cough and shortness of breath.   Cardiovascular: Negative for chest pain.  Gastrointestinal: Negative for vomiting and abdominal pain.  Musculoskeletal: Negative for back pain.       Knee pain  Skin: Negative for rash.  Neurological: Negative for syncope,  numbness and headaches.  Psychiatric/Behavioral:       No behavior change.     Allergies  Acetaminophen; Darvocet; Hydrocodone; Ketorolac tromethamine; and Tramadol  Home Medications  No current outpatient prescriptions on file.  BP 178/105  Pulse 130  Temp 98.6 F (37 C) (Oral)  Resp 20  Ht 5\' 9"  (1.753 m)  Wt 180 lb (81.647 kg)  BMI 26.58 kg/m2  SpO2 96%  Physical Exam  Nursing note and vitals reviewed. Constitutional: He is oriented to person, place, and time. He appears well-developed and well-nourished. No distress.  HENT:  Head: Normocephalic and atraumatic.  Eyes: EOM are normal. Pupils are equal, round, and reactive to light.  Neck: Neck supple. No tracheal deviation present.  Cardiovascular: Normal rate.   Pulmonary/Chest: Effort normal. No respiratory distress.  Abdominal: Soft. He exhibits no distension.  Musculoskeletal: Normal range of motion. He exhibits tenderness. He exhibits no edema.       Right knee tenderness with a mild effusion. Full ROM in right knee. No crepitus. No patella swelling. No ecchymosis.   Neurological: He is alert and oriented to person, place, and time. No sensory deficit.  Skin: Skin is warm and dry.  Psychiatric: He has a normal mood and affect. His behavior is normal.    ED Course  Procedures (including critical care time)  DIAGNOSTIC STUDIES: Oxygen Saturation is 96% on room air, adequate by my interpretation.  COORDINATION OF CARE:  23:24-Discussed planned course of treatment with the patient including pain medication and a knee immobilizer, who is agreeable at this time.   23:30-Medication Orders: Ibuprofen (Advil, Motrin) tablet 800 mg-once; Oxycodone (Oxy IR/Roxicodone) immediate release tablet 5 mg-once     MDM  Patient with new  injury to right knee that occurred yesterday. Mild swelling, no deformity. Placed in knee immobilizer. Referred to his orthopedist.Pt stable in ED with no significant deterioration in  condition.The patient appears reasonably screened and/or stabilized for discharge and I doubt any other medical condition or other [facility] requiring further screening, evaluation, or treatment in the ED at this time prior to discharge.  I personally performed the services described in this documentation, which was scribed in my presence. The recorded information has been reviewed and considered.   MDM Reviewed: nursing note and vitals           Nicoletta Dress. Colon Branch, MD 01/06/12 0900

## 2012-01-05 NOTE — ED Notes (Addendum)
Pt states that he fell off ladder and c/o right knee pain, fell 6-7 feet per pt, denies hitting head, denies any other pain; hx of knee fx to right knee

## 2012-01-06 NOTE — ED Notes (Signed)
Patient does not need anything at this time. 

## 2012-01-25 ENCOUNTER — Encounter (HOSPITAL_COMMUNITY): Payer: Self-pay | Admitting: *Deleted

## 2012-01-25 ENCOUNTER — Emergency Department (HOSPITAL_COMMUNITY): Payer: Self-pay

## 2012-01-25 ENCOUNTER — Emergency Department (HOSPITAL_COMMUNITY)
Admission: EM | Admit: 2012-01-25 | Discharge: 2012-01-25 | Disposition: A | Discharge status: Home / Self Care | Payer: Self-pay | Attending: Emergency Medicine | Admitting: Emergency Medicine

## 2012-01-25 DIAGNOSIS — G8929 Other chronic pain: Secondary | ICD-10-CM | POA: Insufficient documentation

## 2012-01-25 DIAGNOSIS — F17200 Nicotine dependence, unspecified, uncomplicated: Secondary | ICD-10-CM | POA: Insufficient documentation

## 2012-01-25 DIAGNOSIS — M25569 Pain in unspecified knee: Secondary | ICD-10-CM | POA: Insufficient documentation

## 2012-01-25 MED ORDER — IBUPROFEN 800 MG PO TABS: 800.0000 mg | ORAL_TABLET | Freq: Three times a day (TID) | ORAL | Status: AC

## 2012-01-25 MED ORDER — OXYCODONE HCL 5 MG PO TABS: 5.0000 mg | ORAL_TABLET | Freq: Four times a day (QID) | ORAL | Status: AC | PRN

## 2012-01-25 NOTE — ED Provider Notes (Signed)
History     CSN: [id number]  Arrival date & time 01/25/12  1815   First MD Initiated Contact with Patient 01/25/12 1844      Chief Complaint  Patient presents with  . Leg Pain    (Consider location/radiation/quality/duration/timing/severity/associated sxs/prior treatment) HPI Comments: Patient c/o pain to his right knee after he stepped in a hole and twisted his right knee.  Pain is worse with weight bearing and bending.  Improves with rest.  He denies swelling, numbness or weakness of the leg.    Patient is a 31 y.o. male presenting with knee pain. The history is provided by the patient.  Knee Pain This is a new problem. The current episode started yesterday. The problem occurs constantly. The problem has been unchanged. Associated symptoms include arthralgias. Pertinent negatives include no chest pain, chills, fever, joint swelling, neck pain, numbness or vomiting. The symptoms are aggravated by walking, twisting, standing and bending. He has tried nothing for the symptoms. The treatment provided no relief.    Past Medical History  Diagnosis Date  . Cirrhosis   . Chronic knee pain   . Chronic back pain     History reviewed. No pertinent past surgical history.  No family history on file.  History  Substance Use Topics  . Smoking status: Current Some Day Smoker -- 0.5 packs/day    Types: Cigarettes  . Smokeless tobacco: Not on file  . Alcohol Use: No      Review of Systems  Constitutional: Negative for fever and chills.  HENT: Negative for neck pain.   Cardiovascular: Negative for chest pain.  Gastrointestinal: Negative for vomiting.  Genitourinary: Negative for dysuria and difficulty urinating.  Musculoskeletal: Positive for arthralgias. Negative for back pain and joint swelling.  Skin: Negative for color change and wound.  Neurological: Negative for numbness.  All other systems reviewed and are negative.    Allergies  Acetaminophen; Darvocet; Hydrocodone;  Ketorolac tromethamine; and Tramadol  Home Medications  No current outpatient prescriptions on file.  BP 138/75  Pulse 97  Temp 98.5 F (36.9 C)  Resp 18  Ht 5\' 9"  (1.753 m)  Wt 160 lb (72.576 kg)  BMI 23.63 kg/m2  SpO2 100%  Physical Exam  Nursing note and vitals reviewed. Constitutional: He is oriented to person, place, and time. He appears well-developed and well-nourished. No distress.  Cardiovascular: Normal rate, regular rhythm, normal heart sounds and intact distal pulses.   Pulmonary/Chest: Effort normal and breath sounds normal.  Musculoskeletal: He exhibits tenderness. He exhibits no edema.       Right knee: He exhibits normal range of motion, no swelling, no effusion, no deformity, no erythema and no MCL laxity. tenderness found. Patellar tendon tenderness noted.       Legs:      ttp of the right knee.  Pain reproduced with flexion.  No erythema, bruising or deformity.    Neurological: He is alert and oriented to person, place, and time. He exhibits normal muscle tone. Coordination normal.  Skin: Skin is warm and dry. No erythema.    ED Course  Procedures (including critical care time)  Labs Reviewed - No data to display Dg Knee Complete 4 Views Right  01/25/2012  *RADIOLOGY REPORT*  Clinical Data: Pain after twisting injury.  RIGHT KNEE - COMPLETE 4+ VIEW  Comparison: 08/04/2011 CT  Findings: Free osteochondral fragment projects anterior to the femoral condyles, as previously seen.  Small effusion in the suprapatellar bursa.  No acute fracture or dislocation.  Normal mineralization and alignment.  IMPRESSION:  1.  Negative for acute bony abnormality. 2.  Free osteochondral body with small effusion.  Original Report Authenticated By: Osa Craver, M.D.        MDM    Previous ED chart and nursing notes were reviewed by me. Patient with history of chronic knee pain. Pain is reproduced with flexion of the knee. Mild crepitus is present. No erythema,  deformity, or obvious soft tissue swelling. I doubt infectious process. Patient states he is awaiting approval from [facility] for further management of his knee pain   Prescribed:  Percocet #10        Fernando Torry L. [place], [place] 01/28/12 [phone]

## 2012-01-25 NOTE — ED Notes (Signed)
Pt c/o right knee pain x1 day. Pt states he was at work when he "stepped in a hole" and twisted knee. Pt is able to bear weight on right knee but not without pain.

## 2012-01-25 NOTE — ED Notes (Signed)
Pt was at work yesterday when he stepped into a hole twisting right knee area, cms intact distal

## 2012-01-28 ENCOUNTER — Encounter (HOSPITAL_COMMUNITY): Payer: Self-pay | Admitting: *Deleted

## 2012-01-28 ENCOUNTER — Emergency Department (HOSPITAL_COMMUNITY)
Admission: EM | Admit: 2012-01-28 | Discharge: 2012-01-28 | Disposition: A | Discharge status: Home / Self Care | Payer: Self-pay | Attending: Emergency Medicine | Admitting: Emergency Medicine

## 2012-01-28 DIAGNOSIS — G8929 Other chronic pain: Secondary | ICD-10-CM | POA: Insufficient documentation

## 2012-01-28 DIAGNOSIS — M79609 Pain in unspecified limb: Secondary | ICD-10-CM | POA: Insufficient documentation

## 2012-01-28 DIAGNOSIS — Z760 Encounter for issue of repeat prescription: Secondary | ICD-10-CM | POA: Insufficient documentation

## 2012-01-28 DIAGNOSIS — M79604 Pain in right leg: Secondary | ICD-10-CM

## 2012-01-28 DIAGNOSIS — F17200 Nicotine dependence, unspecified, uncomplicated: Secondary | ICD-10-CM | POA: Insufficient documentation

## 2012-01-28 MED ORDER — OXYCODONE-ACETAMINOPHEN 5-325 MG PO TABS: ORAL_TABLET | ORAL | Status: DC

## 2012-01-28 NOTE — ED Notes (Signed)
Pt received prescription for Roxicet without tylenol 5 mg PO QID #24 with d/c instructions, pt verbalized understanding of d/c instructions without further questions, pt ambulated out without any difficultly

## 2012-01-28 NOTE — ED Notes (Signed)
Pt with right knee pain since fall Monday, states he is out of pain meds, states he has an appt with Romeo Apple for this coming Thursday

## 2012-01-28 NOTE — ED Provider Notes (Signed)
Medical screening examination/treatment/procedure(s) were performed by non-physician practitioner and as supervising physician I was immediately available for consultation/collaboration.   Thanya Cegielski, MD 01/28/12 0042 

## 2012-01-28 NOTE — ED Notes (Signed)
Pain rt knee since Monday, slipped when weed eating.

## 2012-01-28 NOTE — ED Provider Notes (Signed)
History     CSN: [id number]  Arrival date & time 01/28/12  1355   First MD Initiated Contact with Patient 01/28/12 1417      Chief Complaint  Patient presents with  . Knee Pain    (Consider location/radiation/quality/duration/timing/severity/associated sxs/prior treatment) HPI Comments: Pt had a R femur fx in oct 2012.  He still has multiple fragments left around the fx.  Dr. Romeo Apple is scheduled to see the pt in 6 days.  He reportedly needs surgery "but i can't get medicaid".  He is out of percocet.  Patient is a 31 y.o. male presenting with knee pain. The history is provided by the patient. No language interpreter was used.  Knee Pain This is a new problem. The problem occurs constantly. The problem has been unchanged. Pertinent negatives include no fever or weakness.    Past Medical History  Diagnosis Date  . Cirrhosis   . Chronic knee pain   . Chronic back pain     History reviewed. No pertinent past surgical history.  History reviewed. No pertinent family history.  History  Substance Use Topics  . Smoking status: Current Some Day Smoker -- 0.5 packs/day    Types: Cigarettes  . Smokeless tobacco: Not on file  . Alcohol Use: No      Review of Systems  Constitutional: Negative for fever.  Musculoskeletal:       Leg pain  Neurological: Negative for weakness.    Allergies  Acetaminophen; Darvocet; Hydrocodone; Ketorolac tromethamine; and Tramadol  Home Medications   Current Outpatient Rx  Name Route Sig Dispense Refill  . IBUPROFEN 800 MG PO TABS Oral Take 1 tablet (800 mg total) by mouth 3 (three) times daily. Take with food 21 tablet 0  . OXYCODONE HCL 5 MG PO TABS Oral Take 1 tablet (5 mg total) by mouth every 6 (six) hours as needed for pain. 10 tablet 0    BP 123/77  Pulse 80  Temp 98.4 F (36.9 C) (Oral)  Resp 18  SpO2 98%  Physical Exam  Nursing note and vitals reviewed. Constitutional: He is oriented to person, place, and time. He appears  well-developed and well-nourished.  HENT:  Head: Normocephalic and atraumatic.  Eyes: EOM are normal.  Neck: Normal range of motion.  Cardiovascular: Normal rate, regular rhythm, normal heart sounds and intact distal pulses.   Pulmonary/Chest: Effort normal and breath sounds normal. No respiratory distress.  Abdominal: Soft. He exhibits no distension. There is no tenderness.  Musculoskeletal: Normal range of motion. He exhibits tenderness.       Right hip: He exhibits tenderness. He exhibits no swelling, no crepitus and no deformity.       Legs: Neurological: He is alert and oriented to person, place, and time.  Skin: Skin is warm and dry.  Psychiatric: He has a normal mood and affect. Judgment normal.    ED Course  Procedures (including critical care time)  Labs Reviewed - No data to display No results found.   1. Right leg pain   2. Prescription refill       MDM  rx-roxicet, 24 F/u with dr. Romeo Apple Call dr. Laurian Brim for pain management evaluation.        Evalina Field, [place] 01/28/12 [phone]

## 2012-01-29 NOTE — ED Provider Notes (Signed)
Medical screening examination/treatment/procedure(s) were performed by non-physician practitioner and as supervising physician I was immediately available for consultation/collaboration.  Donnetta Hutching, MD 01/29/12 [phone]

## 2012-02-09 ENCOUNTER — Emergency Department (HOSPITAL_COMMUNITY)
Admission: EM | Admit: 2012-02-09 | Discharge: 2012-02-10 | Disposition: A | Discharge status: Home / Self Care | Payer: Self-pay | Attending: Emergency Medicine | Admitting: Emergency Medicine

## 2012-02-09 ENCOUNTER — Encounter (HOSPITAL_COMMUNITY): Payer: Self-pay | Admitting: Emergency Medicine

## 2012-02-09 DIAGNOSIS — G8929 Other chronic pain: Secondary | ICD-10-CM | POA: Insufficient documentation

## 2012-02-09 DIAGNOSIS — M545 Low back pain, unspecified: Secondary | ICD-10-CM | POA: Insufficient documentation

## 2012-02-09 DIAGNOSIS — F17200 Nicotine dependence, unspecified, uncomplicated: Secondary | ICD-10-CM | POA: Insufficient documentation

## 2012-02-09 DIAGNOSIS — S39012A Strain of muscle, fascia and tendon of lower back, initial encounter: Secondary | ICD-10-CM

## 2012-02-09 MED ORDER — METHOCARBAMOL 500 MG PO TABS
1000.0000 mg | ORAL_TABLET | Freq: Once | ORAL | Status: AC
  Administered 2012-02-10: 1000 mg via ORAL
  Filled 2012-02-09: qty 2

## 2012-02-09 MED ORDER — METHOCARBAMOL 500 MG PO TABS: 500.0000 mg | ORAL_TABLET | Freq: Two times a day (BID) | ORAL | Status: DC

## 2012-02-09 MED ORDER — OXYCODONE HCL 5 MG PO TABS: 5.0000 mg | ORAL_TABLET | Freq: Four times a day (QID) | ORAL | Status: AC | PRN

## 2012-02-09 MED ORDER — OXYCODONE HCL 5 MG PO TABS
5.0000 mg | ORAL_TABLET | Freq: Once | ORAL | Status: AC
  Administered 2012-02-10: 5 mg via ORAL
  Filled 2012-02-09: qty 1

## 2012-02-09 MED ORDER — METHOCARBAMOL 500 MG PO TABS: ORAL_TABLET | ORAL | Status: DC

## 2012-02-09 NOTE — ED Provider Notes (Signed)
History     CSN: [id number]  Arrival date & time 02/09/12  2239   First MD Initiated Contact with Patient 02/09/12 2245      Chief Complaint  Patient presents with  . Back Pain    (Consider location/radiation/quality/duration/timing/severity/associated sxs/prior treatment) HPI Comments: Patient c/o acute on chronic low back pain that began earlier on the day of ED arrival.  Pain worsened after he was lifting concrete and "felt like I pulled a muscle in my back".  Pain is worse with certain movements and improved with rest.  He denies fever, abd pain, incontinence or urinary sx's.  Patient is a 31 y.o. male presenting with back pain. The history is provided by the patient.  Back Pain  This is a chronic problem. The current episode started 6 to 12 hours ago. The problem occurs constantly. The problem has not changed since onset.The pain is associated with lifting heavy objects and twisting. The pain is present in the lumbar spine. The quality of the pain is described as aching. The pain does not radiate. The pain is moderate. The symptoms are aggravated by bending, twisting and certain positions. Pertinent negatives include no chest pain, no fever, no numbness, no abdominal pain, no abdominal swelling, no bowel incontinence, no perianal numbness, no bladder incontinence, no dysuria, no pelvic pain, no leg pain, no paresthesias, no paresis, no tingling and no weakness. He has tried nothing for the symptoms. The treatment provided no relief.    Past Medical History  Diagnosis Date  . Cirrhosis   . Chronic knee pain   . Chronic back pain     History reviewed. No pertinent past surgical history.  History reviewed. No pertinent family history.  History  Substance Use Topics  . Smoking status: Current Some Day Smoker -- 0.5 packs/day    Types: Cigarettes  . Smokeless tobacco: Not on file  . Alcohol Use: No      Review of Systems  Constitutional: Negative for fever.  Respiratory:  Negative for shortness of breath.   Cardiovascular: Negative for chest pain.  Gastrointestinal: Negative for vomiting, abdominal pain, constipation and bowel incontinence.  Genitourinary: Negative for bladder incontinence, dysuria, hematuria, flank pain, decreased urine volume, difficulty urinating and pelvic pain.       Perineal numbness or incontinence of urine or feces  Musculoskeletal: Positive for back pain. Negative for joint swelling.  Skin: Negative for rash.  Neurological: Negative for tingling, weakness, numbness and paresthesias.  All other systems reviewed and are negative.    Allergies  Acetaminophen; Darvocet; Hydrocodone; Ketorolac tromethamine; and Tramadol  Home Medications  No current outpatient prescriptions on file.  BP 134/79  Pulse 91  Temp 98.6 F (37 C) (Oral)  Resp 16  Ht 5\' 9"  (1.753 m)  Wt 160 lb (72.576 kg)  BMI 23.63 kg/m2  SpO2 98%  Physical Exam  Nursing note and vitals reviewed. Constitutional: He is oriented to person, place, and time. He appears well-developed and well-nourished. No distress.  HENT:  Head: Normocephalic and atraumatic.  Neck: Normal range of motion. Neck supple.  Cardiovascular: Normal rate, regular rhythm and intact distal pulses.   No murmur heard. Pulmonary/Chest: Effort normal and breath sounds normal.  Abdominal: Soft. He exhibits no distension. There is no tenderness.  Musculoskeletal: He exhibits tenderness. He exhibits no edema.       Lumbar back: He exhibits tenderness and pain. He exhibits normal range of motion, no bony tenderness, no swelling, no deformity, no laceration and normal pulse.  Back:  Neurological: He is alert and oriented to person, place, and time. No cranial nerve deficit or sensory deficit. He exhibits normal muscle tone. Coordination and gait normal.  Reflex Scores:      Patellar reflexes are 2+ on the right side and 2+ on the left side.      Achilles reflexes are 2+ on the right side and  2+ on the left side. Skin: Skin is warm and dry.    ED Course  Procedures (including critical care time)  Labs Reviewed - No data to display No results found.   CT L spine from 03/2011 reviewed by me showed:  Mild central canal stenosis at L5-S1 secondary to disc bulging and facet hypertrophy per radiologist interpretation.      MDM  Previous ed charts reviewed  Patient has ttp of the lumbar spine and paraspinal muscles.  No focal neuro deficits on exam.  Ambulates with a steady gait.   Doubt acute infectious or neurological process  Pt has been evaluated here multiple times for chronic back and knee pain.  I have advised pt that he will need further pain management with a PMD or pain management clinic.  Pt verbalized understanding.   Prescribed: Robaxin Oxycodone #10    Joellyn Grandt L. [place], [place] 02/11/12 1701

## 2012-02-09 NOTE — ED Notes (Signed)
Patient states he lifted concrete today and has been having back pain since. States "I think I pulled something in my back."

## 2012-02-16 NOTE — ED Provider Notes (Signed)
Medical screening examination/treatment/procedure(s) were performed by non-physician practitioner and as supervising physician I was immediately available for consultation/collaboration.  John Williamsen, MD 02/16/12 1513 

## 2012-02-17 ENCOUNTER — Encounter (HOSPITAL_COMMUNITY): Payer: Self-pay | Admitting: Emergency Medicine

## 2012-02-17 ENCOUNTER — Emergency Department (HOSPITAL_COMMUNITY)
Admission: EM | Admit: 2012-02-17 | Discharge: 2012-02-17 | Disposition: A | Discharge status: Home / Self Care | Payer: Self-pay | Attending: Emergency Medicine | Admitting: Emergency Medicine

## 2012-02-17 DIAGNOSIS — Z886 Allergy status to analgesic agent status: Secondary | ICD-10-CM | POA: Insufficient documentation

## 2012-02-17 DIAGNOSIS — M25569 Pain in unspecified knee: Secondary | ICD-10-CM | POA: Insufficient documentation

## 2012-02-17 DIAGNOSIS — F17200 Nicotine dependence, unspecified, uncomplicated: Secondary | ICD-10-CM | POA: Insufficient documentation

## 2012-02-17 DIAGNOSIS — M549 Dorsalgia, unspecified: Secondary | ICD-10-CM | POA: Insufficient documentation

## 2012-02-17 DIAGNOSIS — G8929 Other chronic pain: Secondary | ICD-10-CM | POA: Insufficient documentation

## 2012-02-17 MED ORDER — OXYCODONE HCL 5 MG PO TABS
5.0000 mg | ORAL_TABLET | Freq: Once | ORAL | Status: AC
  Administered 2012-02-17: 5 mg via ORAL
  Filled 2012-02-17: qty 1

## 2012-02-17 NOTE — ED Notes (Signed)
Patient states he fell down an embankment yesterday at work and twisted his right knee. Complaining of pain in right knee.

## 2012-02-17 NOTE — ED Provider Notes (Signed)
History     CSN: [id number]  Arrival date & time 02/17/12  0609   First MD Initiated Contact with Patient 02/17/12 [phone]      Chief Complaint  Patient presents with  . Knee Pain    (Consider location/radiation/quality/duration/timing/severity/associated sxs/prior treatment) HPI  Roy [name] is a 31 y.o. male  With a h/o chronic knee and back pain who presents to the Emergency Department complaining of right knee pain after twisting yesterday at work.Pain is worse with walking and bending. He has taken no medicines. He was seen last in the ER 02/09/2012 for back pain and given a prescription for #10 oxy-ir, which he has finished. He is allergic to all NSAIDS. He has been referred to both orthopedist, Dr. Romeo Apple, and pain management Dr. Weldon Inches neither of which he has seen .   Past Medical History  Diagnosis Date  . Cirrhosis   . Chronic knee pain   . Chronic back pain     History reviewed. No pertinent past surgical history.  History reviewed. No pertinent family history.  History  Substance Use Topics  . Smoking status: Current Some Day Smoker -- 0.5 packs/day    Types: Cigarettes  . Smokeless tobacco: Not on file  . Alcohol Use: No      Review of Systems  Constitutional: Negative for fever.       10 Systems reviewed and are negative for acute change except as noted in the HPI.  HENT: Negative for congestion.   Eyes: Negative for discharge and redness.  Respiratory: Negative for cough and shortness of breath.   Cardiovascular: Negative for chest pain.  Gastrointestinal: Negative for vomiting and abdominal pain.  Musculoskeletal: Negative for back pain.       Right knee pain  Skin: Negative for rash.  Neurological: Negative for syncope, numbness and headaches.  Psychiatric/Behavioral:       No behavior change.    Allergies  Acetaminophen; Darvocet; Hydrocodone; Ketorolac tromethamine; and Tramadol  Home Medications   Current Outpatient Rx  Name Route  Sig Dispense Refill  . METHOCARBAMOL 500 MG PO TABS  Two tabs po TID prn muscle spasms 42 tablet 0  . OXYCODONE HCL 5 MG PO TABS Oral Take 1 tablet (5 mg total) by mouth every 6 (six) hours as needed for pain. 10 tablet 0    BP 130/76  Pulse 64  Temp 97.5 F (36.4 C) (Oral)  Resp 14  Ht 5\' 9"  (1.753 m)  Wt 165 lb (74.844 kg)  BMI 24.37 kg/m2  SpO2 100%  Physical Exam  Nursing note and vitals reviewed. Constitutional:       Awake, alert, nontoxic appearance.  HENT:  Head: Atraumatic.  Eyes: Right eye exhibits no discharge. Left eye exhibits no discharge.  Neck: Neck supple.  Pulmonary/Chest: Effort normal. He exhibits no tenderness.  Abdominal: Soft. There is no tenderness. There is no rebound.  Musculoskeletal: Normal range of motion. He exhibits no tenderness.       Baseline ROM, no obvious new focal weakness.No effusion, mild medial tenderness to right knee.   Neurological:       Mental status and motor strength appears baseline for patient and situation.  Skin: No rash noted.  Psychiatric: He has a normal mood and affect.    ED Course  Procedures (including critical care time)    No diagnosis found.  Spoke with patient regarding multiple visits to the ER for pain and requiring narcotic analgesics. Agreed to give him analgesic here.  Will not provide a prescription. Referral to orthopedics again.  MDM  Patient with chronic knee and back pain here with knee pain following a twisting episode. Pt stable in ED with no significant deterioration in condition.The patient appears reasonably screened and/or stabilized for discharge and I doubt any other medical condition or other [facility] requiring further screening, evaluation, or treatment in the ED at this time prior to discharge.  MDM Reviewed: nursing note, vitals and previous chart Reviewed previous: x-ray           Nicoletta Dress. Colon Branch, MD 02/17/12 [phone]

## 2012-02-29 ENCOUNTER — Encounter (HOSPITAL_COMMUNITY): Payer: Self-pay | Admitting: *Deleted

## 2012-02-29 ENCOUNTER — Emergency Department (HOSPITAL_COMMUNITY): Payer: Self-pay

## 2012-02-29 ENCOUNTER — Emergency Department (HOSPITAL_COMMUNITY)
Admission: EM | Admit: 2012-02-29 | Discharge: 2012-02-29 | Disposition: A | Discharge status: Home / Self Care | Payer: Self-pay | Attending: Emergency Medicine | Admitting: Emergency Medicine

## 2012-02-29 DIAGNOSIS — F17200 Nicotine dependence, unspecified, uncomplicated: Secondary | ICD-10-CM | POA: Insufficient documentation

## 2012-02-29 DIAGNOSIS — G8929 Other chronic pain: Secondary | ICD-10-CM | POA: Insufficient documentation

## 2012-02-29 DIAGNOSIS — M25561 Pain in right knee: Secondary | ICD-10-CM

## 2012-02-29 DIAGNOSIS — M25569 Pain in unspecified knee: Secondary | ICD-10-CM | POA: Insufficient documentation

## 2012-02-29 MED ORDER — OXYCODONE HCL 5 MG PO TABS: 5.0000 mg | ORAL_TABLET | ORAL | Status: AC | PRN

## 2012-02-29 NOTE — ED Notes (Signed)
Pt states that he fell down a hill yesterday while at work weedeating, pain to right knee.

## 2012-02-29 NOTE — ED Provider Notes (Signed)
History     CSN: [id number]  Arrival date & time 02/29/12  0745   First MD Initiated Contact with Patient 02/29/12 [phone]      Chief Complaint  Patient presents with  . Knee Pain    (Consider location/radiation/quality/duration/timing/severity/associated sxs/prior treatment) HPI Comments: Weed-eating yesterday and twisted R knee and fell.  Has chronic R knee and back pain.  States he plans to have medicaid in about 2 weeks and will be going back to see dr. Romeo Apple.  i also pulled pt up on state narcotic data base and counted roughly 800 tablets of hydrocodone and oxycodone he has been prescribed since November of 2012.  He states he cannot take tylenol.  i asked why he takes the medicine that has tylenol in it and he said "i don't.  i throw it away".  But the database reveals that he gets 60 tablets at a time and gets a prescription refill for 60 more in 7-10 days.  i asked why he continues to get the prescriptions filled and then throws them away.  He had no answer.  Patient is a 31 y.o. male presenting with knee pain. The history is provided by the patient. No language interpreter was used.  Knee Pain This is a new problem. The current episode started yesterday. The problem occurs constantly. The problem has been unchanged. Pertinent negatives include no chills or fever. The symptoms are aggravated by walking and standing. He has tried nothing for the symptoms.    Past Medical History  Diagnosis Date  . Cirrhosis   . Chronic knee pain   . Chronic back pain     History reviewed. No pertinent past surgical history.  No family history on file.  History  Substance Use Topics  . Smoking status: Current Some Day Smoker -- 0.5 packs/day    Types: Cigarettes  . Smokeless tobacco: Not on file  . Alcohol Use: No      Review of Systems  Constitutional: Negative for fever and chills.  Musculoskeletal:       Knee injury  Skin: Negative for wound.  All other systems reviewed and  are negative.    Allergies  Acetaminophen; Darvocet; Hydrocodone; Ketorolac tromethamine; and Tramadol  Home Medications   Current Outpatient Rx  Name Route Sig Dispense Refill  . METHOCARBAMOL 500 MG PO TABS Oral Take 500 mg by mouth 3 (three) times daily as needed. Muscle Spasms    . OXYCODONE HCL 5 MG PO TABS Oral Take 1 tablet (5 mg total) by mouth every 4 (four) hours as needed for pain. 10 tablet 0    BP 145/97  Pulse 117  Temp 99 F (37.2 C)  Resp 20  SpO2 98%  Physical Exam  Nursing note and vitals reviewed. Constitutional: He is oriented to person, place, and time. He appears well-developed and well-nourished.  HENT:  Head: Normocephalic and atraumatic.  Eyes: EOM are normal.  Neck: Normal range of motion.  Cardiovascular: Normal rate, regular rhythm, normal heart sounds and intact distal pulses.   Pulmonary/Chest: Effort normal and breath sounds normal. No respiratory distress.  Abdominal: Soft. He exhibits no distension. There is no tenderness.  Musculoskeletal: He exhibits tenderness.       Right knee: He exhibits decreased range of motion and swelling. He exhibits no deformity. tenderness found.  Neurological: He is alert and oriented to person, place, and time.  Skin: Skin is warm and dry.  Psychiatric: He has a normal mood and affect. Judgment normal.  ED Course  Procedures (including critical care time)  Labs Reviewed - No data to display Dg Knee Complete 4 Views Right  02/29/2012  *RADIOLOGY REPORT*  Clinical Data: Fall, knee pain.  RIGHT KNEE - COMPLETE 4+ VIEW  Comparison: 01/25/2012  Findings: Again noted is a small loose body within the joint space. Small joint effusion noted, stable.  No acute fracture, subluxation or dislocation.  No change.  IMPRESSION: Stable small intra-articular loose body and joint effusion.  No acute findings.   Original Report Authenticated By: Cyndie Chime, M.D.      1. Right knee pain       MDM  No fracture.   X-ray unchanged.  Knee immobilizer Ice Ibuprofen  F/u with dr. Carollee Herter, PA 02/29/12 1003

## 2012-03-03 NOTE — ED Provider Notes (Signed)
Medical screening examination/treatment/procedure(s) were performed by non-physician practitioner and as supervising physician I was immediately available for consultation/collaboration.  Donnetta Hutching, MD 03/03/12 [phone]

## 2012-03-14 ENCOUNTER — Emergency Department (HOSPITAL_COMMUNITY)
Admission: EM | Admit: 2012-03-14 | Discharge: 2012-03-14 | Disposition: A | Discharge status: Home / Self Care | Payer: Self-pay | Attending: Emergency Medicine | Admitting: Emergency Medicine

## 2012-03-14 ENCOUNTER — Encounter (HOSPITAL_COMMUNITY): Payer: Self-pay | Admitting: Emergency Medicine

## 2012-03-14 DIAGNOSIS — G8929 Other chronic pain: Secondary | ICD-10-CM | POA: Insufficient documentation

## 2012-03-14 DIAGNOSIS — S335XXA Sprain of ligaments of lumbar spine, initial encounter: Secondary | ICD-10-CM | POA: Insufficient documentation

## 2012-03-14 DIAGNOSIS — S39012A Strain of muscle, fascia and tendon of lower back, initial encounter: Secondary | ICD-10-CM

## 2012-03-14 DIAGNOSIS — X500XXA Overexertion from strenuous movement or load, initial encounter: Secondary | ICD-10-CM | POA: Insufficient documentation

## 2012-03-14 DIAGNOSIS — F17200 Nicotine dependence, unspecified, uncomplicated: Secondary | ICD-10-CM | POA: Insufficient documentation

## 2012-03-14 MED ORDER — DEXAMETHASONE 4 MG PO TABS: ORAL_TABLET | ORAL | Status: DC

## 2012-03-14 MED ORDER — DEXAMETHASONE SODIUM PHOSPHATE 4 MG/ML IJ SOLN
8.0000 mg | Freq: Once | INTRAMUSCULAR | Status: AC
  Administered 2012-03-14: 8 mg via INTRAMUSCULAR
  Filled 2012-03-14: qty 2

## 2012-03-14 MED ORDER — MORPHINE SULFATE 4 MG/ML IJ SOLN
8.0000 mg | Freq: Once | INTRAMUSCULAR | Status: AC
  Administered 2012-03-14: 8 mg via INTRAMUSCULAR
  Filled 2012-03-14: qty 2

## 2012-03-14 MED ORDER — METHOCARBAMOL 500 MG PO TABS
1000.0000 mg | ORAL_TABLET | Freq: Once | ORAL | Status: AC
  Administered 2012-03-14: 1000 mg via ORAL
  Filled 2012-03-14: qty 2

## 2012-03-14 MED ORDER — METHOCARBAMOL 500 MG PO TABS: ORAL_TABLET | ORAL | Status: DC

## 2012-03-14 MED ORDER — ONDANSETRON HCL 4 MG PO TABS
4.0000 mg | ORAL_TABLET | Freq: Once | ORAL | Status: AC
  Administered 2012-03-14: 4 mg via ORAL
  Filled 2012-03-14: qty 1

## 2012-03-14 NOTE — ED Notes (Signed)
Pain lt side low back, onset 430p when lifting lumber, felt pain in back, "I think I pulled a muscle". Has taken motrin without relief.

## 2012-03-14 NOTE — ED Notes (Signed)
Roy [name] talked to pt about frequency of visits for pain meds and gave him advice about seeing  Another MD  For control of his pain,  Advice about pain clinic , orthopedist or pcp.

## 2012-03-14 NOTE — ED Provider Notes (Signed)
History     CSN: [id number]  Arrival date & time 03/14/12  2149   First MD Initiated Contact with Patient 03/14/12 2159      Chief Complaint  Patient presents with  . Back Pain    lifting and pulled something in his back    (Consider location/radiation/quality/duration/timing/severity/associated sxs/prior treatment) Patient is a 31 y.o. male presenting with back pain. The history is provided by the patient.  Back Pain  This is a new (acute on chronic) problem. The current episode started 3 to 5 hours ago. The problem occurs constantly. The problem has been gradually worsening. The pain is associated with lifting heavy objects. The pain is present in the lumbar spine. The quality of the pain is described as aching. The pain radiates to the right thigh. The pain is moderate. The symptoms are aggravated by certain positions. The pain is the same all the time. Associated symptoms include abdominal pain. Pertinent negatives include no chest pain, no bowel incontinence, no perianal numbness, no bladder incontinence and no dysuria. He has tried muscle relaxants and analgesics for the symptoms. The treatment provided no relief.    Past Medical History  Diagnosis Date  . Cirrhosis   . Chronic knee pain   . Chronic back pain     History reviewed. No pertinent past surgical history.  No family history on file.  History  Substance Use Topics  . Smoking status: Current Some Day Smoker -- 0.5 packs/day    Types: Cigarettes  . Smokeless tobacco: Not on file  . Alcohol Use: No      Review of Systems  Constitutional: Negative for activity change.       All ROS Neg except as noted in HPI  HENT: Negative for nosebleeds and neck pain.   Eyes: Negative for photophobia and discharge.  Respiratory: Negative for cough, shortness of breath and wheezing.   Cardiovascular: Negative for chest pain and palpitations.  Gastrointestinal: Positive for abdominal pain. Negative for blood in stool and  bowel incontinence.  Genitourinary: Negative for bladder incontinence, dysuria, frequency and hematuria.  Musculoskeletal: Positive for back pain and arthralgias.  Skin: Negative.   Neurological: Negative for dizziness, seizures and speech difficulty.  Psychiatric/Behavioral: Negative for hallucinations and confusion.    Allergies  Acetaminophen; Darvocet; Hydrocodone; Ketorolac tromethamine; and Tramadol  Home Medications   Current Outpatient Rx  Name Route Sig Dispense Refill  . IBUPROFEN 200 MG PO TABS Oral Take 400 mg by mouth once as needed. For pain    . OXYCODONE HCL 5 MG PO CAPS Oral Take 5 mg by mouth once as needed. For pain    . DEXAMETHASONE 4 MG PO TABS  1 po daily with food 6 tablet 0  . METHOCARBAMOL 500 MG PO TABS  2 po tid for spasm/pain 30 tablet 0    BP 131/68  Pulse 101  Temp 98.4 F (36.9 C) (Oral)  Resp 20  Ht 5\' 9"  (1.753 m)  Wt 160 lb (72.576 kg)  BMI 23.63 kg/m2  SpO2 100%  Physical Exam  Nursing note and vitals reviewed. Constitutional: He is oriented to person, place, and time. He appears well-developed and well-nourished.  Non-toxic appearance.  HENT:  Head: Normocephalic.  Right Ear: Tympanic membrane and external ear normal.  Left Ear: Tympanic membrane and external ear normal.  Eyes: EOM and lids are normal. Pupils are equal, round, and reactive to light.  Neck: Normal range of motion. Neck supple. Carotid bruit is not present.  Cardiovascular:  Normal rate, regular rhythm, normal heart sounds, intact distal pulses and normal pulses.   Pulmonary/Chest: Breath sounds normal. No respiratory distress.  Abdominal: Soft. Bowel sounds are normal. There is no tenderness. There is no guarding.  Musculoskeletal: Normal range of motion.       Patient has pain and spasm of the lumbar area and paraspinal area right more than left. There is no palpable step off appreciated.  Lymphadenopathy:       Head (right side): No submandibular adenopathy present.         Head (left side): No submandibular adenopathy present.    He has no cervical adenopathy.  Neurological: He is alert and oriented to person, place, and time. He has normal strength. No cranial nerve deficit or sensory deficit. He exhibits normal muscle tone. Coordination normal.       There is no foot drop or change in heel-to-toe coronation. She is  Skin: Skin is warm and dry.  Psychiatric: He has a normal mood and affect. His speech is normal.    ED Course  Procedures (including critical care time)  Labs Reviewed - No data to display No results found.   1. Lumbar strain       MDM  I have reviewed nursing notes, vital signs, and all appropriate lab and imaging results for this patient. No gross neurologic deficits appreciated during the examination tonight. Patient treated in the emergency department with intramuscular Decadron and morphine. Oral Robaxin and Zofran. Patient's previous visits were reviewed. Patient has received narcotic prescriptions mostly Roxicet and Percocet each month since July. Discussed with the patient the role of the emergency department, and the need for the patient to a telemetry himself with a primary care physician, pain management specialist, orthopedic specialist, or [facility] clinic for assistance with his recurrent pain issues. Prescription for Decadron and Robaxin given to the patient. Explained to the patient that I would not be issuing a prescription for Roxicet. Nurse Chip Boer present during my explanation to the patient.       Kathie Dike, [place] 03/14/12 2316

## 2012-03-14 NOTE — ED Notes (Signed)
Lifting (working on a side job) and felt pain in back earlier today.

## 2012-03-15 NOTE — ED Provider Notes (Signed)
Medical screening examination/treatment/procedure(s) were performed by non-physician practitioner and as supervising physician I was immediately available for consultation/collaboration.  Josue Falconi, MD 03/15/12 1722 

## 2012-04-07 ENCOUNTER — Emergency Department (HOSPITAL_COMMUNITY)
Admission: EM | Admit: 2012-04-07 | Discharge: 2012-04-08 | Disposition: A | Discharge status: Home / Self Care | Payer: Self-pay | Attending: Emergency Medicine | Admitting: Emergency Medicine

## 2012-04-07 ENCOUNTER — Encounter (HOSPITAL_COMMUNITY): Payer: Self-pay | Admitting: *Deleted

## 2012-04-07 DIAGNOSIS — F17200 Nicotine dependence, unspecified, uncomplicated: Secondary | ICD-10-CM | POA: Insufficient documentation

## 2012-04-07 DIAGNOSIS — S40011A Contusion of right shoulder, initial encounter: Secondary | ICD-10-CM

## 2012-04-07 DIAGNOSIS — W11XXXA Fall on and from ladder, initial encounter: Secondary | ICD-10-CM | POA: Insufficient documentation

## 2012-04-07 DIAGNOSIS — Y9389 Activity, other specified: Secondary | ICD-10-CM | POA: Insufficient documentation

## 2012-04-07 DIAGNOSIS — G8929 Other chronic pain: Secondary | ICD-10-CM | POA: Insufficient documentation

## 2012-04-07 DIAGNOSIS — Y998 Other external cause status: Secondary | ICD-10-CM | POA: Insufficient documentation

## 2012-04-07 DIAGNOSIS — S40019A Contusion of unspecified shoulder, initial encounter: Secondary | ICD-10-CM | POA: Insufficient documentation

## 2012-04-07 NOTE — ED Notes (Addendum)
Pt reporting pain in right shoulder and side following fall off ladder.  Reports ability to move shoulder, but experiences severe pain.  No numbness or tingling in fingers.  Reports pain in rib area was due to arm being dug into side when he fell.  Pt denies direct injury to side.

## 2012-04-08 ENCOUNTER — Emergency Department (HOSPITAL_COMMUNITY): Payer: Self-pay

## 2012-04-08 MED ORDER — OXYCODONE HCL 5 MG PO TABS
5.0000 mg | ORAL_TABLET | Freq: Once | ORAL | Status: AC
  Administered 2012-04-08: 5 mg via ORAL
  Filled 2012-04-08: qty 1

## 2012-04-08 MED ORDER — OXYCODONE HCL 5 MG PO TABS: ORAL_TABLET | ORAL | Status: DC

## 2012-04-08 NOTE — Discharge Instructions (Signed)
Contusion A contusion is a deep bruise. Contusions happen when an injury causes bleeding under the skin. Signs of bruising include pain, puffiness (swelling), and discolored skin. The contusion may turn blue, purple, or yellow. HOME CARE   Put ice on the injured area.  Put ice in a plastic bag.  Place a towel between your skin and the bag.  Leave the ice on for 15 to 20 minutes, 3 to 4 times a day.  Only take medicine as told by your doctor.  Rest the injured area.  If possible, raise (elevate) the injured area to lessen puffiness. GET HELP RIGHT AWAY IF:   You have more bruising or puffiness.  You have pain that is getting worse.  Your puffiness or pain is not helped by medicine. MAKE SURE YOU:   Understand these instructions.  Will watch your condition.  Will get help right away if you are not doing well or get worse. Document Released: 11/24/2007 Document Revised: 08/30/2011 Document Reviewed: 04/12/2011 [facility] Patient Information 2013 [place], [place]. Cryotherapy Cryotherapy means treatment with cold. Ice or gel packs can be used to reduce both pain and swelling. Ice is the most helpful within the first 24 to 48 hours after an injury or flareup from overusing a muscle or joint. Sprains, strains, spasms, burning pain, shooting pain, and aches can all be eased with ice. Ice can also be used when recovering from surgery. Ice is effective, has very few side effects, and is safe for most people to use. PRECAUTIONS  Ice is not a safe treatment option for people with:  Raynaud's phenomenon. This is a condition affecting small blood vessels in the extremities. Exposure to cold may cause your problems to return.  Cold hypersensitivity. There are many forms of cold hypersensitivity, including:  Cold urticaria. Red, itchy hives appear on the skin when the tissues begin to warm after being iced.  Cold erythema. This is a red, itchy rash caused by exposure to cold.  Cold  hemoglobinuria. Red blood cells break down when the tissues begin to warm after being iced. The hemoglobin that carry oxygen are passed into the urine because they cannot combine with blood proteins fast enough.  Numbness or altered sensitivity in the area being iced. If you have any of the following conditions, do not use ice until you have discussed cryotherapy with your caregiver:  Heart conditions, such as arrhythmia, angina, or chronic heart disease.  High blood pressure.  Healing wounds or open skin in the area being iced.  Current infections.  Rheumatoid arthritis.  Poor circulation.  Diabetes. Ice slows the blood flow in the region it is applied. This is beneficial when trying to stop inflamed tissues from spreading irritating chemicals to surrounding tissues. However, if you expose your skin to cold temperatures for too long or without the proper protection, you can damage your skin or nerves. Watch for signs of skin damage due to cold. HOME CARE INSTRUCTIONS Follow these tips to use ice and cold packs safely.  Place a dry or damp towel between the ice and skin. A damp towel will cool the skin more quickly, so you may need to shorten the time that the ice is used.  For a more rapid response, add gentle compression to the ice.  Ice for no more than 10 to 20 minutes at a time. The bonier the area you are icing, the less time it will take to get the benefits of ice.  Check your skin after 5 minutes  to make sure there are no signs of a poor response to cold or skin damage.  Rest 20 minutes or more in between uses.  Once your skin is numb, you can end your treatment. You can test numbness by very lightly touching your skin. The touch should be so light that you do not see the skin dimple from the pressure of your fingertip. When using ice, most people will feel these normal sensations in this order: cold, burning, aching, and numbness.  Do not use ice on someone who cannot  communicate their responses to pain, such as small children or people with dementia. HOW TO MAKE AN ICE PACK Ice packs are the most common way to use ice therapy. Other methods include ice massage, ice baths, and cryo-sprays. Muscle creams that cause a cold, tingly feeling do not offer the same benefits that ice offers and should not be used as a substitute unless recommended by your caregiver. To make an ice pack, do one of the following:  Place crushed ice or a bag of frozen vegetables in a sealable plastic bag. Squeeze out the excess air. Place this bag inside another plastic bag. Slide the bag into a pillowcase or place a damp towel between your skin and the bag.  Mix 3 parts water with 1 part rubbing alcohol. Freeze the mixture in a sealable plastic bag. When you remove the mixture from the freezer, it will be slushy. Squeeze out the excess air. Place this bag inside another plastic bag. Slide the bag into a pillowcase or place a damp towel between your skin and the bag. SEEK MEDICAL CARE IF:  You develop white spots on your skin. This may give the skin a blotchy (mottled) appearance.  Your skin turns blue or pale.  Your skin becomes waxy or hard.  Your swelling gets worse. MAKE SURE YOU:   Understand these instructions.  Will watch your condition.  Will get help right away if you are not doing well or get worse. Document Released: 02/01/2011 Document Revised: 08/30/2011 Document Reviewed: 02/01/2011 [facility] Patient Information 2013 [place], [place].   Take the pain medicine as directed.  Apply ice several times daily.  Wear the sling for comfort.  Follow up with either of the orthopedists.

## 2012-04-08 NOTE — ED Provider Notes (Signed)
Medical screening examination/treatment/procedure(s) were performed by non-physician practitioner and as supervising physician I was immediately available for consultation/collaboration.  Nicoletta Dress. Colon Branch, MD 04/08/12 [phone]

## 2012-04-08 NOTE — ED Provider Notes (Signed)
History     CSN: [id number]  Arrival date & time 04/07/12  2306   First MD Initiated Contact with Patient 04/08/12 0007      Chief Complaint  Patient presents with  . Shoulder Injury    (Consider location/radiation/quality/duration/timing/severity/associated sxs/prior treatment) HPI Comments: Standing on top of a 6 ft ladder nailing a gutter.  The ladder slid out from under him and he fell on his R shoulder.  No other injuries or complaints.  Patient is a 31 y.o. male presenting with shoulder injury. The history is provided by the patient. No language interpreter was used.  Shoulder Injury This is a new problem. The current episode started today. The problem occurs constantly. The problem has been unchanged. Pertinent negatives include no numbness or weakness. Exacerbated by: movement and palpation' He has tried nothing for the symptoms.    Past Medical History  Diagnosis Date  . Cirrhosis   . Chronic knee pain   . Chronic back pain     History reviewed. No pertinent past surgical history.  History reviewed. No pertinent family history.  History  Substance Use Topics  . Smoking status: Current Some Day Smoker -- 0.5 packs/day    Types: Cigarettes  . Smokeless tobacco: Not on file  . Alcohol Use: No      Review of Systems  Musculoskeletal:       Shoulder injury   Skin: Negative for wound.  Neurological: Negative for weakness and numbness.  All other systems reviewed and are negative.    Allergies  Acetaminophen; Darvocet; Hydrocodone; Ketorolac tromethamine; and Tramadol  Home Medications   Current Outpatient Rx  Name Route Sig Dispense Refill  . DEXAMETHASONE 4 MG PO TABS  1 po daily with food 6 tablet 0  . IBUPROFEN 200 MG PO TABS Oral Take 400 mg by mouth once as needed. For pain    . METHOCARBAMOL 500 MG PO TABS  2 po tid for spasm/pain 30 tablet 0  . OXYCODONE HCL 5 MG PO TABS  One tab po q 6 hrs prn pain 12 tablet 0  . OXYCODONE HCL 5 MG PO CAPS  Oral Take 5 mg by mouth once as needed. For pain      BP 135/89  Pulse 102  Temp 98.4 F (36.9 C) (Oral)  Resp 18  Ht 5\' 9"  (1.753 m)  Wt 160 lb (72.576 kg)  BMI 23.63 kg/m2  SpO2 100%  Physical Exam  Nursing note and vitals reviewed. Constitutional: He is oriented to person, place, and time. He appears well-developed and well-nourished.  HENT:  Head: Normocephalic and atraumatic.  Eyes: EOM are normal.  Neck: Normal range of motion.  Cardiovascular: Normal rate, regular rhythm and intact distal pulses.   Pulmonary/Chest: Effort normal. No respiratory distress.  Abdominal: Soft. He exhibits no distension. There is no tenderness.  Musculoskeletal: He exhibits tenderness.       Arms: Neurological: He is alert and oriented to person, place, and time.  Skin: Skin is warm and dry.  Psychiatric: He has a normal mood and affect. Judgment normal.    ED Course  Procedures (including critical care time)  Labs Reviewed - No data to display Dg Shoulder Right  04/08/2012  *RADIOLOGY REPORT*  Clinical Data: Shoulder pain, fall  RIGHT SHOULDER - 2+ VIEW  Comparison: None.  Findings: No fracture or dislocation is seen.  The joint spaces are preserved.  The visualized soft tissues are unremarkable.  Visualized right lung is clear.  IMPRESSION: Normal  shoulder radiographs.   Original Report Authenticated By: Charline Bills, M.D.      1. Contusion of right shoulder       MDM  No fx  Sling, ice rx-roxicet, 12 F/u with dr. Hilda Lias or Carollee Herter, [place] 04/08/12 [phone]

## 2012-04-23 ENCOUNTER — Emergency Department (HOSPITAL_COMMUNITY)
Admission: EM | Admit: 2012-04-23 | Discharge: 2012-04-23 | Disposition: A | Discharge status: Home / Self Care | Payer: Self-pay | Attending: Emergency Medicine | Admitting: Emergency Medicine

## 2012-04-23 ENCOUNTER — Encounter (HOSPITAL_COMMUNITY): Payer: Self-pay | Admitting: *Deleted

## 2012-04-23 DIAGNOSIS — Y929 Unspecified place or not applicable: Secondary | ICD-10-CM | POA: Insufficient documentation

## 2012-04-23 DIAGNOSIS — S40019A Contusion of unspecified shoulder, initial encounter: Secondary | ICD-10-CM | POA: Insufficient documentation

## 2012-04-23 DIAGNOSIS — G8929 Other chronic pain: Secondary | ICD-10-CM | POA: Insufficient documentation

## 2012-04-23 DIAGNOSIS — W11XXXA Fall on and from ladder, initial encounter: Secondary | ICD-10-CM | POA: Insufficient documentation

## 2012-04-23 DIAGNOSIS — F17200 Nicotine dependence, unspecified, uncomplicated: Secondary | ICD-10-CM | POA: Insufficient documentation

## 2012-04-23 DIAGNOSIS — K7460 Unspecified cirrhosis of liver: Secondary | ICD-10-CM | POA: Insufficient documentation

## 2012-04-23 DIAGNOSIS — Y9389 Activity, other specified: Secondary | ICD-10-CM | POA: Insufficient documentation

## 2012-04-23 MED ORDER — IBUPROFEN 800 MG PO TABS
800.0000 mg | ORAL_TABLET | Freq: Once | ORAL | Status: AC
  Administered 2012-04-23: 800 mg via ORAL
  Filled 2012-04-23: qty 1

## 2012-04-23 MED ORDER — IBUPROFEN 800 MG PO TABS: 800.0000 mg | ORAL_TABLET | Freq: Three times a day (TID) | ORAL | Status: DC

## 2012-04-23 NOTE — ED Notes (Addendum)
Pt having continued right shoulder pain after falling off a ladder x 2 wks ago, out of pain meds.

## 2012-04-23 NOTE — ED Provider Notes (Signed)
History  This chart was scribed for Roy Octave, MD by Ardeen Jourdain. This patient was seen in room APA06/APA06 and the patient's care was started at 2232.  CSN: [id number]  Arrival date & time 04/23/12  2203   First MD Initiated Contact with Patient 04/23/12 2232      Chief Complaint  Patient presents with  . Shoulder Pain     The history is provided by the patient. No language interpreter was used.    Roy [name] is a 31 y.o. male who presents to the Emergency Department complaining of right shoulder pain due to an injury 2 weeks ago. He states he was cleaning the gutters and fell at the time of the injury, but he did not break his shoulder. He reports using his shoulder to lift things recently but denies other injury. He also reports being prescribed oxycodone when he was here last, but taking nothing for the pain since. He has a history of cirrhosis, chronic knee pain and chronic back pain. He is a current everyday smoker but denies alcohol use.    Past Medical History  Diagnosis Date  . Cirrhosis   . Chronic knee pain   . Chronic back pain     History reviewed. No pertinent past surgical history.  History reviewed. No pertinent family history.  History  Substance Use Topics  . Smoking status: Current Some Day Smoker -- 0.5 packs/day    Types: Cigarettes  . Smokeless tobacco: Not on file  . Alcohol Use: No      Review of Systems  All other systems reviewed and are negative.    A complete 10 system review of systems was obtained and all systems are negative except as noted in the HPI and PMH.    Allergies  Acetaminophen; Darvocet; Hydrocodone; Ketorolac tromethamine; and Tramadol  Home Medications  No current outpatient prescriptions on file.  Triage Vitals: BP 128/79  Pulse 101  Temp 98.3 F (36.8 C)  Resp 20  Ht 5\' 9"  (1.753 m)  Wt 160 lb (72.576 kg)  BMI 23.63 kg/m2  SpO2 99%  Physical Exam  Nursing note and vitals  reviewed. Constitutional: He is oriented to person, place, and time. He appears well-developed and well-nourished. No distress.  HENT:  Head: Normocephalic and atraumatic.  Eyes: EOM are normal. Pupils are equal, round, and reactive to light.  Neck: Normal range of motion. Neck supple. No tracheal deviation present.  Cardiovascular: Normal rate, regular rhythm and normal heart sounds.        +2 radial pulse   Pulmonary/Chest: Effort normal and breath sounds normal. No respiratory distress.  Abdominal: Soft. He exhibits no distension. There is no tenderness.  Musculoskeletal: Normal range of motion. He exhibits no edema.       Lateral right shoulder tenderness  Neurological: He is alert and oriented to person, place, and time.       axillary nerves intact, cardinal hand movements intact  Skin: Skin is warm and dry.  Psychiatric: He has a normal mood and affect. His behavior is normal.    ED Course  Procedures (including critical care time)  DIAGNOSTIC STUDIES: Oxygen Saturation is 99% on room air, normal by my interpretation.    COORDINATION OF CARE:  10:47 PM: Discussed treatment plan which includes ice and rest with pt at bedside and pt agreed to plan.    Labs Reviewed - No data to display No results found.   No diagnosis found.    MDM  R shoulder pain since falling off ladder 10/19.  Out of pain meds. Negative Xray here. +2 radial pulse, cardinal hand movement intact, axillary nerve sensation intact. TTP lateral shoulder without ecchymosis, warmth, erythema or deformity.  Xray reviewed. Avoid narcotics.    I personally performed the services described in this documentation, which was scribed in my presence.  The recorded information has been reviewed and considered.   Roy Octave, MD 04/24/12 0200

## 2012-05-23 ENCOUNTER — Emergency Department (HOSPITAL_COMMUNITY)
Admission: EM | Admit: 2012-05-23 | Discharge: 2012-05-23 | Disposition: A | Discharge status: Home / Self Care | Payer: Self-pay | Attending: Emergency Medicine | Admitting: Emergency Medicine

## 2012-05-23 ENCOUNTER — Emergency Department (HOSPITAL_COMMUNITY): Payer: Self-pay

## 2012-05-23 ENCOUNTER — Encounter (HOSPITAL_COMMUNITY): Payer: Self-pay | Admitting: Emergency Medicine

## 2012-05-23 DIAGNOSIS — S7001XA Contusion of right hip, initial encounter: Secondary | ICD-10-CM

## 2012-05-23 DIAGNOSIS — Y92009 Unspecified place in unspecified non-institutional (private) residence as the place of occurrence of the external cause: Secondary | ICD-10-CM | POA: Insufficient documentation

## 2012-05-23 DIAGNOSIS — W010XXA Fall on same level from slipping, tripping and stumbling without subsequent striking against object, initial encounter: Secondary | ICD-10-CM | POA: Insufficient documentation

## 2012-05-23 DIAGNOSIS — S7000XA Contusion of unspecified hip, initial encounter: Secondary | ICD-10-CM | POA: Insufficient documentation

## 2012-05-23 DIAGNOSIS — Y939 Activity, unspecified: Secondary | ICD-10-CM | POA: Insufficient documentation

## 2012-05-23 DIAGNOSIS — M25569 Pain in unspecified knee: Secondary | ICD-10-CM | POA: Insufficient documentation

## 2012-05-23 DIAGNOSIS — K7460 Unspecified cirrhosis of liver: Secondary | ICD-10-CM | POA: Insufficient documentation

## 2012-05-23 DIAGNOSIS — Y999 Unspecified external cause status: Secondary | ICD-10-CM | POA: Insufficient documentation

## 2012-05-23 DIAGNOSIS — F17200 Nicotine dependence, unspecified, uncomplicated: Secondary | ICD-10-CM | POA: Insufficient documentation

## 2012-05-23 DIAGNOSIS — G8929 Other chronic pain: Secondary | ICD-10-CM | POA: Insufficient documentation

## 2012-05-23 DIAGNOSIS — M549 Dorsalgia, unspecified: Secondary | ICD-10-CM | POA: Insufficient documentation

## 2012-05-23 MED ORDER — NAPROXEN 500 MG PO TABS: 500.0000 mg | ORAL_TABLET | Freq: Two times a day (BID) | ORAL | Status: DC

## 2012-05-23 MED ORDER — OXYCODONE-ACETAMINOPHEN 5-325 MG PO TABS
2.0000 | ORAL_TABLET | Freq: Once | ORAL | Status: AC
  Administered 2012-05-23: 2 via ORAL
  Filled 2012-05-23: qty 2

## 2012-05-23 NOTE — ED Notes (Signed)
Patient states had some icy spots on his porch and he slipped and fell last night; c/o right hip pain.

## 2012-05-23 NOTE — ED Provider Notes (Signed)
History     CSN: [id number]  Arrival date & time 05/23/12  0227   First MD Initiated Contact with Patient 05/23/12 0243      Chief Complaint  Patient presents with  . Fall  . Hip Pain    (Consider location/radiation/quality/duration/timing/severity/associated sxs/prior treatment) HPI Comments: 31 year old male who presents with a complaint of right hip and pelvis pain after slipping and falling on an icy patch on his back porch at his house approximately 24 hours ago. Since that time he has been laying around the house, he is avoided walking because of pain in the hip but he is able to walk with a limp. He denies any changes in sensation, there is no numbness or weakness to the right lower extremity and no other injuries including head, neck, chest, arms. Symptoms are persistent, worse with walking. Review of the medical record shows that the patient has had frequent visits for pain complaints and minor trauma  Patient is a 31 y.o. male presenting with fall and hip pain. The history is provided by the patient and medical records.  Fall Pertinent negatives include no numbness.  Hip Pain    Past Medical History  Diagnosis Date  . Cirrhosis   . Chronic knee pain   . Chronic back pain     History reviewed. No pertinent past surgical history.  No family history on file.  History  Substance Use Topics  . Smoking status: Current Some Day Smoker -- 0.5 packs/day    Types: Cigarettes  . Smokeless tobacco: Not on file  . Alcohol Use: No      Review of Systems  Musculoskeletal: Negative for joint swelling.  Skin: Negative for wound.  Neurological: Negative for weakness and numbness.    Allergies  Acetaminophen; Darvocet; Hydrocodone; Ketorolac tromethamine; and Tramadol  Home Medications   Current Outpatient Rx  Name  Route  Sig  Dispense  Refill  . IBUPROFEN 800 MG PO TABS   Oral   Take 1 tablet (800 mg total) by mouth 3 (three) times daily.   21 tablet   0   .  NAPROXEN 500 MG PO TABS   Oral   Take 1 tablet (500 mg total) by mouth 2 (two) times daily with a meal.   30 tablet   0     BP 146/88  Pulse 99  Temp 98.8 F (37.1 C) (Oral)  Resp 18  Ht 5\' 9"  (1.753 m)  Wt 160 lb (72.576 kg)  BMI 23.63 kg/m2  SpO2 96%  Physical Exam  Nursing note and vitals reviewed. Constitutional: He appears well-developed and well-nourished. No distress.  HENT:  Head: Normocephalic and atraumatic.  Eyes: Conjunctivae normal are normal. No scleral icterus.  Cardiovascular: Normal rate, regular rhythm and intact distal pulses.   Pulmonary/Chest: Effort normal and breath sounds normal.  Musculoskeletal: He exhibits tenderness ( Tender to palpation over the right lateral hip over the greater trochanter and the iliac crest). He exhibits no edema.       No leg length discrepancy, mild pain with range of motion, rotation of the right hip  Neurological: He is alert.       Normal sensation and strength to the right lower extremity, antalgic  Skin: Skin is warm and dry. No rash noted. He is not diaphoretic.    ED Course  Procedures (including critical care time)  Labs Reviewed - No data to display No results found.   1. Contusion of hip, right  MDM  Patient has pain complaints, likely contusion, rule out fracture with x-rays of the pelvis, patient has allergies to most pain medications.   I have personally interpreted the x-ray of the pelvis, there is no signs of fractures or dislocations, soft tissues appear normal, visualized bowel appears normal. Patient reexamined, appear stable for discharge, ambulatory with some discomfort, 2 Percocet given prior to discharge, Naprosyn for home     Vida Roller, MD 05/23/12 0425

## 2012-05-26 ENCOUNTER — Emergency Department (HOSPITAL_COMMUNITY)
Admission: EM | Admit: 2012-05-26 | Discharge: 2012-05-26 | Disposition: A | Discharge status: Home / Self Care | Payer: Self-pay | Attending: Emergency Medicine | Admitting: Emergency Medicine

## 2012-05-26 ENCOUNTER — Encounter (HOSPITAL_COMMUNITY): Payer: Self-pay | Admitting: *Deleted

## 2012-05-26 DIAGNOSIS — Z8719 Personal history of other diseases of the digestive system: Secondary | ICD-10-CM | POA: Insufficient documentation

## 2012-05-26 DIAGNOSIS — Z79899 Other long term (current) drug therapy: Secondary | ICD-10-CM | POA: Insufficient documentation

## 2012-05-26 DIAGNOSIS — Z8739 Personal history of other diseases of the musculoskeletal system and connective tissue: Secondary | ICD-10-CM | POA: Insufficient documentation

## 2012-05-26 DIAGNOSIS — F17200 Nicotine dependence, unspecified, uncomplicated: Secondary | ICD-10-CM | POA: Insufficient documentation

## 2012-05-26 DIAGNOSIS — M25559 Pain in unspecified hip: Secondary | ICD-10-CM | POA: Insufficient documentation

## 2012-05-26 NOTE — ED Provider Notes (Signed)
History   This chart was scribed for Geoffery Lyons, MD by Charolett Bumpers, ED Scribe. The patient was seen in room APA19/APA19. Patient's care was started at 0701.    CSN: [id number]  Arrival date & time 05/26/12  0865   First MD Initiated Contact with Patient 05/26/12 0701      Chief Complaint  Patient presents with  . Hip Pain     The history is provided by the patient. No language interpreter was used.   Roy [name] is a 31 y.o. male who presents to the Emergency Department complaining of constant, moderate right hip pain. He state that he fell 5 days ago and was seen here on the 2nd and dx with a contusion after an x-ray showed no fractures. He states that his pain has worsened since. He states that he has difficulty getting up, but is able to ambulate. He denies any acute change in his pain. He states that he has an appointment with Dr. Romeo Apple on Wednesday for a fx knee in which he will have surgery. He has a h/o chronic shoulder, back, knee, leg, dental pain. He states he doesn't have a PCP.   Past Medical History  Diagnosis Date  . Cirrhosis   . Chronic knee pain   . Chronic back pain     History reviewed. No pertinent past surgical history.  No family history on file.  History  Substance Use Topics  . Smoking status: Current Some Day Smoker -- 0.5 packs/day    Types: Cigarettes  . Smokeless tobacco: Not on file  . Alcohol Use: No      Review of Systems  Musculoskeletal: Positive for arthralgias.  All other systems reviewed and are negative.    Allergies  Acetaminophen; Darvocet; Hydrocodone; Ketorolac tromethamine; and Tramadol  Home Medications   Current Outpatient Rx  Name  Route  Sig  Dispense  Refill  . IBUPROFEN 800 MG PO TABS   Oral   Take 1 tablet (800 mg total) by mouth 3 (three) times daily.   21 tablet   0   . NAPROXEN 500 MG PO TABS   Oral   Take 1 tablet (500 mg total) by mouth 2 (two) times daily with a meal.   30  tablet   0     BP 149/97  Pulse 102  Temp 98.4 F (36.9 C) (Oral)  Resp 18  Ht 5\' 6"  (1.676 m)  Wt 160 lb (72.576 kg)  BMI 25.82 kg/m2  SpO2 100%  Physical Exam  Nursing note and vitals reviewed. Constitutional: He is oriented to person, place, and time. He appears well-developed and well-nourished. No distress.  HENT:  Head: Normocephalic and atraumatic.  Eyes: EOM are normal. Pupils are equal, round, and reactive to light.  Neck: Normal range of motion. Neck supple. No tracheal deviation present.  Cardiovascular: Normal rate.   Pulmonary/Chest: Effort normal. No respiratory distress.  Abdominal: Soft. He exhibits no distension.  Musculoskeletal: Normal range of motion. He exhibits tenderness. He exhibits no edema.       Tenderness to palpation over the lateral aspect of the right hip but appears to be grossly intact. No bruising or swelling noted. Pain with ROM but able to ambulate.   Neurological: He is alert and oriented to person, place, and time.  Skin: Skin is warm and dry.  Psychiatric: He has a normal mood and affect. His behavior is normal.    ED Course  Procedures (including critical care time)  DIAGNOSTIC STUDIES: Oxygen Saturation is 100% on room air, normal by my interpretation.    COORDINATION OF CARE:  07:18-Discussed planned course of treatment with the patient including ibuprofen and f/u with Dr. Romeo Apple, who is agreeable at this time.    Labs Reviewed - No data to display No results found.   No diagnosis found.    MDM  This patient presents for the second time for hip pain following an injury.  He is requesting oxycontin.  The frequency and nature of his visits along with his list of allergies makes me uncomfortable with providing him an additional prescription for narcotic pain medications.  This seems like a minor injury and his pain is out of proportion with physical findings.  He was informed that he needs to obtain a pcp to manage his  chronic pain.     I personally performed the services described in this documentation, which was scribed in my presence. The recorded information has been reviewed and is accurate.        Geoffery Lyons, MD 05/26/12 1450

## 2012-05-26 NOTE — ED Notes (Signed)
Pt was seen here on the 2nd. Dx w/ a contusion, pt states was told to return if got worse so pt is back.

## 2012-06-04 ENCOUNTER — Encounter (HOSPITAL_COMMUNITY): Payer: Self-pay | Admitting: Emergency Medicine

## 2012-06-04 ENCOUNTER — Emergency Department (HOSPITAL_COMMUNITY)
Admission: EM | Admit: 2012-06-04 | Discharge: 2012-06-04 | Disposition: A | Discharge status: Home / Self Care | Payer: Self-pay | Attending: Emergency Medicine | Admitting: Emergency Medicine

## 2012-06-04 DIAGNOSIS — F17200 Nicotine dependence, unspecified, uncomplicated: Secondary | ICD-10-CM | POA: Insufficient documentation

## 2012-06-04 DIAGNOSIS — M25569 Pain in unspecified knee: Secondary | ICD-10-CM | POA: Insufficient documentation

## 2012-06-04 DIAGNOSIS — M549 Dorsalgia, unspecified: Secondary | ICD-10-CM

## 2012-06-04 DIAGNOSIS — S335XXA Sprain of ligaments of lumbar spine, initial encounter: Secondary | ICD-10-CM | POA: Insufficient documentation

## 2012-06-04 DIAGNOSIS — Y9389 Activity, other specified: Secondary | ICD-10-CM | POA: Insufficient documentation

## 2012-06-04 DIAGNOSIS — Y929 Unspecified place or not applicable: Secondary | ICD-10-CM | POA: Insufficient documentation

## 2012-06-04 DIAGNOSIS — Z8719 Personal history of other diseases of the digestive system: Secondary | ICD-10-CM | POA: Insufficient documentation

## 2012-06-04 DIAGNOSIS — T148XXA Other injury of unspecified body region, initial encounter: Secondary | ICD-10-CM

## 2012-06-04 DIAGNOSIS — G8929 Other chronic pain: Secondary | ICD-10-CM | POA: Insufficient documentation

## 2012-06-04 DIAGNOSIS — X500XXA Overexertion from strenuous movement or load, initial encounter: Secondary | ICD-10-CM | POA: Insufficient documentation

## 2012-06-04 MED ORDER — METHOCARBAMOL 500 MG PO TABS: ORAL_TABLET | ORAL | Status: DC

## 2012-06-04 MED ORDER — NAPROXEN 250 MG PO TABS
500.0000 mg | ORAL_TABLET | Freq: Once | ORAL | Status: AC
  Administered 2012-06-04: 500 mg via ORAL
  Filled 2012-06-04: qty 2

## 2012-06-04 MED ORDER — DICLOFENAC SODIUM 75 MG PO TBEC
75.0000 mg | DELAYED_RELEASE_TABLET | Freq: Once | ORAL | Status: DC
  Filled 2012-06-04: qty 1

## 2012-06-04 MED ORDER — DICLOFENAC SODIUM 75 MG PO TBEC: 75.0000 mg | DELAYED_RELEASE_TABLET | Freq: Two times a day (BID) | ORAL | Status: DC

## 2012-06-04 MED ORDER — DIAZEPAM 5 MG PO TABS
10.0000 mg | ORAL_TABLET | Freq: Once | ORAL | Status: AC
  Administered 2012-06-04: 10 mg via ORAL
  Filled 2012-06-04: qty 2
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
## 2012-06-05 NOTE — ED Provider Notes (Signed)
Medical screening examination/treatment/procedure(s) were performed by non-physician practitioner and as supervising physician I was immediately available for consultation/collaboration.   Derwin Reddy M Gianne Shugars, MD 06/05/12 2038 

## 2012-08-10 ENCOUNTER — Encounter (HOSPITAL_COMMUNITY): Payer: Self-pay | Admitting: Emergency Medicine

## 2012-08-10 ENCOUNTER — Emergency Department (HOSPITAL_COMMUNITY)
Admission: EM | Admit: 2012-08-10 | Discharge: 2012-08-10 | Disposition: A | Discharge status: Home / Self Care | Payer: Self-pay | Attending: Emergency Medicine | Admitting: Emergency Medicine

## 2012-08-10 ENCOUNTER — Emergency Department (HOSPITAL_COMMUNITY): Payer: Self-pay

## 2012-08-10 DIAGNOSIS — R52 Pain, unspecified: Secondary | ICD-10-CM | POA: Insufficient documentation

## 2012-08-10 DIAGNOSIS — M25569 Pain in unspecified knee: Secondary | ICD-10-CM | POA: Insufficient documentation

## 2012-08-10 DIAGNOSIS — G8929 Other chronic pain: Secondary | ICD-10-CM | POA: Insufficient documentation

## 2012-08-10 DIAGNOSIS — Z8719 Personal history of other diseases of the digestive system: Secondary | ICD-10-CM | POA: Insufficient documentation

## 2012-08-10 DIAGNOSIS — F17200 Nicotine dependence, unspecified, uncomplicated: Secondary | ICD-10-CM | POA: Insufficient documentation

## 2012-08-10 DIAGNOSIS — M25561 Pain in right knee: Secondary | ICD-10-CM

## 2012-08-10 DIAGNOSIS — Z87828 Personal history of other (healed) physical injury and trauma: Secondary | ICD-10-CM | POA: Insufficient documentation

## 2012-08-10 MED ORDER — NAPROXEN 375 MG PO TABS: 375.0000 mg | ORAL_TABLET | Freq: Two times a day (BID) | ORAL | Status: DC | PRN

## 2012-08-10 MED ORDER — OXYCODONE-ACETAMINOPHEN 5-325 MG PO TABS
2.0000 | ORAL_TABLET | Freq: Once | ORAL | Status: AC
  Administered 2012-08-10: 2 via ORAL
  Filled 2012-08-10: qty 2

## 2012-08-10 NOTE — ED Provider Notes (Signed)
History  This chart was scribed for Raeford Razor, MD by Bennett Scrape, ED Scribe. This patient was seen in room APA08/APA08 and the patient's care was started at 7:53 AM.  CSN: [id number]  Arrival date & time 08/10/12  0746   First MD Initiated Contact with Patient 08/10/12 [phone]      Chief Complaint  Patient presents with  . Knee Pain    The history is provided by the patient. No language interpreter was used.    Roy [name] is a 32 y.o. male who presents to the Emergency Department complaining of a sudden worsening of chronic right knee pain after stepping in a hole and twisting the knee. He states that he has been ambulating on it since the incident with little difficulty. He reports taking ibuprofen for the pain with no improvement. He denies HA, back pain, neck pain, abdominal pain, CP, weakness and numbness as associated symptoms. He reports a prior fracture to the same knee and states that he was following up with Dr. Romeo Apple but his last visit was one year ago due to not being able to afford the money to pay for surgery. He states that he is currently applying for Medicaid. He admits that he wants "something to tie me over for the next few days". He has a h/o cirrhosis and is a current everyday smoker but denies alcohol use.   He has been seen in the ED several times for the same complaint.  Past Medical History  Diagnosis Date  . Cirrhosis   . Chronic knee pain   . Chronic back pain     No past surgical history on file.  No family history on file.  History  Substance Use Topics  . Smoking status: Current Some Day Smoker -- 0.50 packs/day    Types: Cigarettes  . Smokeless tobacco: Not on file  . Alcohol Use: No      Review of Systems  HENT: Negative for neck pain.   Cardiovascular: Negative for chest pain.  Gastrointestinal: Negative for abdominal pain.  Musculoskeletal: Positive for arthralgias (right knee). Negative for back pain.  Neurological:  Negative for weakness and numbness.  All other systems reviewed and are negative.    Allergies  Acetaminophen; Darvocet; Hydrocodone; Ketorolac tromethamine; and Tramadol  Home Medications   Current Outpatient Rx  Name  Route  Sig  Dispense  Refill  . diclofenac (VOLTAREN) 75 MG EC tablet   Oral   Take 1 tablet (75 mg total) by mouth 2 (two) times daily.   12 tablet   0   . ibuprofen (ADVIL,MOTRIN) 800 MG tablet   Oral   Take 1 tablet (800 mg total) by mouth 3 (three) times daily.   21 tablet   0   . methocarbamol (ROBAXIN) 500 MG tablet      2 po tid for spasm pain   30 tablet   0   . naproxen (NAPROSYN) 500 MG tablet   Oral   Take 1 tablet (500 mg total) by mouth 2 (two) times daily with a meal.   30 tablet   0     Triage Vitals: BP 130/92  Pulse 122  Temp(Src) 98.6 F (37 C) (Oral)  Resp 17  SpO2 96%  Physical Exam  Nursing note and vitals reviewed. Constitutional: He is oriented to person, place, and time. He appears well-developed and well-nourished. No distress.  HENT:  Head: Normocephalic and atraumatic.  Eyes: EOM are normal.  Neck: Neck supple. No  tracheal deviation present.  Cardiovascular: Regular rhythm.  Tachycardia present.   Pulmonary/Chest: Effort normal and breath sounds normal. No respiratory distress.  Musculoskeletal: Normal range of motion.  Mild swelling in the right knee compared to the left, no overlaying skin lesions, no ligamentous laxity appreciated, no ecchymosis, resists ROM, neurovascularly intact distally  Neurological: He is alert and oriented to person, place, and time.  Skin: Skin is warm and dry.  Psychiatric: He has a normal mood and affect. His behavior is normal.    ED Course  Procedures (including critical care time)  DIAGNOSTIC STUDIES: Oxygen Saturation is 96% on room air, adequate by my interpretation.    COORDINATION OF CARE: 8:04 AM-Discussed treatment plan which includes xrays of the right knee and pain  medication with pt at bedside and pt agreed to plan. Advised pt that he will need to follow up with Dr. Romeo Apple and pt agreed.  8:15 AM- Ordered two 5-325 mg percocet tablets  9:02 AM-Pt rechecked and is snoring. Informed pt of radiology and lab work results. Discussed discharge plan which includes f/u with Dr. Romeo Apple with pt and pt agreed to plan.   9:08 AM- Pt called me back in the room to ask for narcotics. I advised pt that I will not prescribe narcotic pain medications.  Labs Reviewed - No data to display Dg Knee Complete 4 Views Right  08/10/2012  *RADIOLOGY REPORT*  Clinical Data: Knee pain following twisting injury yesterday.  RIGHT KNEE - COMPLETE 4+ VIEW  Comparison: Radiographs 02/29/2012 and 01/25/2012.  CT 08/04/2011.  Findings: There is no evidence of acute fracture or dislocation. The patella is located.  There is a moderate sized knee joint effusion.  There is stable post-traumatic deformity of the lateral femoral condyle anteriorly with an adjacent large fracture fragment, likely partially incorporated into the distal femur based on prior CT.  The loose body anteromedially appears unchanged.  IMPRESSION:  1.  Stable post-traumatic findings with intra-articular loose bodies as described. 2.  Persistent or recurrent knee joint effusion. 3.  No acute osseous findings identified.  Consider follow-up MRI, especially if there is clinical concern of recurrent transient patellar dislocation injury.   Original Report Authenticated By: Carey Bullocks, M.D.      1. Right knee pain       MDM  31yM with acute on chronic knee pain. Imaging with findings noted on previous. NV intact. Pt with multiple ED visits for same. Has not follow-up with orthopedics in past year. Instructed to do so if having persistent symptoms. NSAIDs/tylenol PRN pain. RICE.   I personally preformed the services scribed in my presence. The recorded information has been reviewed is accurate. Raeford Razor,  MD.        Raeford Razor, MD 08/14/12 [phone]

## 2012-08-10 NOTE — ED Notes (Signed)
Stepped in a hole yesterday and now c/o r knee pain. Denies hitting head. Refused w/c to room. Nad.

## 2012-09-05 ENCOUNTER — Encounter (HOSPITAL_COMMUNITY): Payer: Self-pay | Admitting: *Deleted

## 2012-09-05 ENCOUNTER — Emergency Department (HOSPITAL_COMMUNITY)
Admission: EM | Admit: 2012-09-05 | Discharge: 2012-09-05 | Disposition: A | Discharge status: Home / Self Care | Payer: Self-pay | Attending: Emergency Medicine | Admitting: Emergency Medicine

## 2012-09-05 ENCOUNTER — Emergency Department (HOSPITAL_COMMUNITY): Payer: Self-pay

## 2012-09-05 DIAGNOSIS — M25569 Pain in unspecified knee: Secondary | ICD-10-CM | POA: Insufficient documentation

## 2012-09-05 DIAGNOSIS — Y9289 Other specified places as the place of occurrence of the external cause: Secondary | ICD-10-CM | POA: Insufficient documentation

## 2012-09-05 DIAGNOSIS — W010XXA Fall on same level from slipping, tripping and stumbling without subsequent striking against object, initial encounter: Secondary | ICD-10-CM | POA: Insufficient documentation

## 2012-09-05 DIAGNOSIS — Y9389 Activity, other specified: Secondary | ICD-10-CM | POA: Insufficient documentation

## 2012-09-05 DIAGNOSIS — F17200 Nicotine dependence, unspecified, uncomplicated: Secondary | ICD-10-CM | POA: Insufficient documentation

## 2012-09-05 DIAGNOSIS — M25561 Pain in right knee: Secondary | ICD-10-CM

## 2012-09-05 DIAGNOSIS — M549 Dorsalgia, unspecified: Secondary | ICD-10-CM | POA: Insufficient documentation

## 2012-09-05 DIAGNOSIS — S8990XA Unspecified injury of unspecified lower leg, initial encounter: Secondary | ICD-10-CM | POA: Insufficient documentation

## 2012-09-05 DIAGNOSIS — Z8719 Personal history of other diseases of the digestive system: Secondary | ICD-10-CM | POA: Insufficient documentation

## 2012-09-05 DIAGNOSIS — G8929 Other chronic pain: Secondary | ICD-10-CM | POA: Insufficient documentation

## 2012-09-05 MED ORDER — OXYCODONE-ACETAMINOPHEN 5-325 MG PO TABS
2.0000 | ORAL_TABLET | Freq: Once | ORAL | Status: AC
  Administered 2012-09-05: 2 via ORAL
  Filled 2012-09-05: qty 2

## 2012-09-05 MED ORDER — NAPROXEN 500 MG PO TABS: 500.0000 mg | ORAL_TABLET | Freq: Two times a day (BID) | ORAL | Status: DC

## 2012-09-05 NOTE — ED Notes (Signed)
States he slipped on his front porch yesterday and twisted his right knee

## 2012-09-05 NOTE — ED Provider Notes (Signed)
History     CSN: [id number]  Arrival date & time 09/05/12  1754   First MD Initiated Contact with Patient 09/05/12 1827      Chief Complaint  Patient presents with  . Knee Pain    (Consider location/radiation/quality/duration/timing/severity/associated sxs/prior treatment) Patient is a 32 y.o. male presenting with knee pain. The history is provided by the patient.  Knee Pain Location:  Knee Time since incident:  24 hours Injury: yes   Mechanism of injury comment:  Patient slipped and twisted the right knee Knee location:  R knee Pain details:    Quality:  Aching and throbbing   Radiates to:  Does not radiate   Severity:  Mild   Onset quality:  Sudden   Timing:  Constant   Progression:  Unchanged Chronicity:  New Dislocation: no   Foreign body present:  No foreign bodies Prior injury to area:  Yes Relieved by:  Nothing Worsened by:  Nothing tried Ineffective treatments:  NSAIDs Associated symptoms: no back pain, no decreased ROM, no fever, no muscle weakness, no neck pain, no numbness, no stiffness, no swelling and no tingling     Past Medical History  Diagnosis Date  . Cirrhosis   . Chronic knee pain   . Chronic back pain     History reviewed. No pertinent past surgical history.  No family history on file.  History  Substance Use Topics  . Smoking status: Current Some Day Smoker -- 0.50 packs/day    Types: Cigarettes  . Smokeless tobacco: Not on file  . Alcohol Use: No      Review of Systems  Constitutional: Negative for fever and chills.  HENT: Negative for neck pain.   Genitourinary: Negative for dysuria and difficulty urinating.  Musculoskeletal: Positive for joint swelling and arthralgias. Negative for back pain and stiffness.  Skin: Negative for color change and wound.  Neurological: Negative for weakness and numbness.  All other systems reviewed and are negative.    Allergies  Acetaminophen; Darvocet; Hydrocodone; Ketorolac tromethamine;  and Tramadol  Home Medications   Current Outpatient Rx  Name  Route  Sig  Dispense  Refill  . ibuprofen (ADVIL,MOTRIN) 200 MG tablet   Oral   Take 400-600 mg by mouth daily as needed for pain.            BP 128/80  Pulse 97  Temp(Src) 98 F (36.7 C)  Resp 20  Ht 5\' 9"  (1.753 m)  Wt 175 lb (79.379 kg)  BMI 25.83 kg/m2  SpO2 99%  Physical Exam  Nursing note and vitals reviewed. Constitutional: He is oriented to person, place, and time. He appears well-developed and well-nourished. No distress.  Cardiovascular: Normal rate, regular rhythm, normal heart sounds and intact distal pulses.   Pulmonary/Chest: Effort normal and breath sounds normal.  Musculoskeletal: He exhibits tenderness.  ttp of the medial and lateral right knee.  Pain reproduced with flexion.  No erythema, excessive warmth, bruising or bony deformity.  No calf pain or edema.  DP pulses are brisk, distal sensation intact  Neurological: He is alert and oriented to person, place, and time. He exhibits normal muscle tone. Coordination normal.  Skin: Skin is warm and dry. No erythema.    ED Course  Procedures (including critical care time)  Labs Reviewed - No data to display Dg Knee Complete 4 Views Right  09/05/2012  *RADIOLOGY REPORT*  Clinical Data: Lateral right knee pain following a fall yesterday.  RIGHT KNEE - COMPLETE 4+ VIEW  Comparison: 08/10/2012.  Findings: Small effusion with an interval decrease in size.  Stable loose ossific body anterior to the distal femoral condyles.  Stable previously demonstrated partially fused bone fragment adjacent to the lateral aspect of the lateral femoral condyle, better seen on the CT dated 08/04/2011.  No acute fracture or dislocation.  IMPRESSION:  1.  Stable old post-traumatic changes with and anterior loose bodies, as described above. 2.  No acute fracture or dislocation. 3.  Small effusion with an interval decrease in size.   Original Report Authenticated By: Beckie Salts,  M.D.         MDM   Diffuse ttp of the medical and lateral right knee.  No step-off deformity, effusion erythema or edema of the joint.  Doubt acute injury or septic joint.   Previous ED chart reviewed.  Patient was advised to f/u wit Dr. Romeo Apple which he has seen previously for his knee pain.  I have advised him that he needs to f/u wit orthopedics, that the ED cannot be responsible for managing his chronic knee pain.  He verbalized understanding and agrees to the care plan.    Pt has a knee immob at home.  Pt agrees to d/c the advil while taking the naprosyn     Marianna Cid L. Jaelynne Hockley, PA-C 09/07/12 1513

## 2012-09-09 ENCOUNTER — Emergency Department (HOSPITAL_COMMUNITY)
Admission: EM | Admit: 2012-09-09 | Discharge: 2012-09-09 | Disposition: A | Discharge status: Home / Self Care | Payer: Self-pay

## 2012-09-09 NOTE — ED Notes (Signed)
Pt was called x 3 for triage but was not in waiting room.  ER staff saw pt walking down the street.

## 2012-09-10 NOTE — ED Provider Notes (Signed)
Medical screening examination/treatment/procedure(s) were performed by non-physician practitioner and as supervising physician I was immediately available for consultation/collaboration.   Laray Anger, DO 09/10/12 0124

## 2012-10-31 ENCOUNTER — Emergency Department (HOSPITAL_COMMUNITY)
Admission: EM | Admit: 2012-10-31 | Discharge: 2012-10-31 | Disposition: A | Discharge status: Home / Self Care | Payer: Self-pay | Attending: Emergency Medicine | Admitting: Emergency Medicine

## 2012-10-31 ENCOUNTER — Emergency Department (HOSPITAL_COMMUNITY): Payer: Self-pay

## 2012-10-31 ENCOUNTER — Encounter (HOSPITAL_COMMUNITY): Payer: Self-pay | Admitting: *Deleted

## 2012-10-31 DIAGNOSIS — S99919A Unspecified injury of unspecified ankle, initial encounter: Secondary | ICD-10-CM | POA: Insufficient documentation

## 2012-10-31 DIAGNOSIS — X500XXA Overexertion from strenuous movement or load, initial encounter: Secondary | ICD-10-CM | POA: Insufficient documentation

## 2012-10-31 DIAGNOSIS — Y990 Civilian activity done for income or pay: Secondary | ICD-10-CM | POA: Insufficient documentation

## 2012-10-31 DIAGNOSIS — S8990XA Unspecified injury of unspecified lower leg, initial encounter: Secondary | ICD-10-CM | POA: Insufficient documentation

## 2012-10-31 DIAGNOSIS — Z791 Long term (current) use of non-steroidal anti-inflammatories (NSAID): Secondary | ICD-10-CM | POA: Insufficient documentation

## 2012-10-31 DIAGNOSIS — Y9301 Activity, walking, marching and hiking: Secondary | ICD-10-CM | POA: Insufficient documentation

## 2012-10-31 DIAGNOSIS — Y9289 Other specified places as the place of occurrence of the external cause: Secondary | ICD-10-CM | POA: Insufficient documentation

## 2012-10-31 DIAGNOSIS — S8991XA Unspecified injury of right lower leg, initial encounter: Secondary | ICD-10-CM

## 2012-10-31 DIAGNOSIS — G8929 Other chronic pain: Secondary | ICD-10-CM | POA: Insufficient documentation

## 2012-10-31 DIAGNOSIS — Z8719 Personal history of other diseases of the digestive system: Secondary | ICD-10-CM | POA: Insufficient documentation

## 2012-10-31 DIAGNOSIS — F17200 Nicotine dependence, unspecified, uncomplicated: Secondary | ICD-10-CM | POA: Insufficient documentation

## 2012-10-31 MED ORDER — FAMOTIDINE 20 MG PO TABS: 20.0000 mg | ORAL_TABLET | Freq: Two times a day (BID) | ORAL | Status: DC

## 2012-10-31 MED ORDER — IBUPROFEN 800 MG PO TABS: 800.0000 mg | ORAL_TABLET | Freq: Three times a day (TID) | ORAL | Status: DC

## 2012-10-31 MED ORDER — IBUPROFEN 800 MG PO TABS
800.0000 mg | ORAL_TABLET | Freq: Once | ORAL | Status: AC
  Administered 2012-10-31: 800 mg via ORAL
  Filled 2012-10-31: qty 1

## 2012-10-31 NOTE — ED Notes (Signed)
Pt alert & oriented x4, stable gait. Patient given discharge instructions, paperwork & prescription(s). Patient  instructed to stop at the registration desk to finish any additional paperwork. Patient verbalized understanding. Pt left department w/ no further questions. 

## 2012-10-31 NOTE — ED Provider Notes (Signed)
History     CSN: [id number]  Arrival date & time 10/31/12  0125   First MD Initiated Contact with Patient 10/31/12 [phone]      Chief Complaint  Patient presents with  . Knee Injury    (Consider location/radiation/quality/duration/timing/severity/associated sxs/prior treatment) Patient is a 32 y.o. male presenting with knee pain. The history is provided by the patient.  Knee Pain Location:  Knee Time since incident:  1 day Injury: yes   Mechanism of injury comment:  Twisted it while walkling Knee location:  R knee Pain details:    Quality:  Aching and throbbing   Radiates to:  Does not radiate   Severity:  Moderate   Onset quality:  Unable to specify   Duration:  1 day   Timing:  Constant   Progression:  Unchanged Chronicity:  Recurrent Dislocation: no   Foreign body present:  No foreign bodies Prior injury to area:  Yes Relieved by:  Nothing Worsened by:  Activity Ineffective treatments:  NSAIDs Associated symptoms: no back pain, no fever and no neck pain     Past Medical History  Diagnosis Date  . Cirrhosis   . Chronic knee pain   . Chronic back pain     History reviewed. No pertinent past surgical history.  No family history on file.  History  Substance Use Topics  . Smoking status: Current Some Day Smoker -- 0.50 packs/day    Types: Cigarettes  . Smokeless tobacco: Not on file  . Alcohol Use: No      Review of Systems  Constitutional: Negative for fever and chills.  HENT: Negative for neck pain and neck stiffness.   Eyes: Negative for visual disturbance.  Respiratory: Negative for shortness of breath.   Cardiovascular: Negative for chest pain.  Gastrointestinal: Negative for nausea, vomiting and abdominal pain.  Genitourinary: Negative for dysuria.  Musculoskeletal: Negative for back pain.  Skin: Negative for rash.  Neurological: Negative for headaches.  All other systems reviewed and are negative.    Allergies  Acetaminophen; Darvocet;  Hydrocodone; Ketorolac tromethamine; and Tramadol  Home Medications   Current Outpatient Rx  Name  Route  Sig  Dispense  Refill  . ibuprofen (ADVIL,MOTRIN) 200 MG tablet   Oral   Take 400-600 mg by mouth daily as needed for pain.          . naproxen (NAPROSYN) 500 MG tablet   Oral   Take 1 tablet (500 mg total) by mouth 2 (two) times daily with a meal.   20 tablet   0     BP 131/92  Pulse 82  Temp(Src) 97.8 F (36.6 C) (Oral)  Resp 16  Ht 5\' 9"  (1.753 m)  Wt 180 lb (81.647 kg)  BMI 26.57 kg/m2  SpO2 98%  Physical Exam  Constitutional: He is oriented to person, place, and time. He appears well-developed and well-nourished.  HENT:  Head: Normocephalic and atraumatic.  Eyes: EOM are normal. Pupils are equal, round, and reactive to light.  Neck: Neck supple.  Cardiovascular: Normal rate, regular rhythm and intact distal pulses.   Pulmonary/Chest: Effort normal and breath sounds normal. No respiratory distress.  Abdominal: Soft. Bowel sounds are normal. He exhibits no distension. There is no tenderness.  Musculoskeletal:  RLE: TTP over knee with mild effusion, dec ROM 2/2 pain, no obvious instability, distal n/v intact. NTTP ankle and hip  Neurological: He is alert and oriented to person, place, and time.  Skin: Skin is warm and dry.  ED Course  Procedures (including critical care time)  Labs Reviewed - No data to display Dg Knee Complete 4 Views Right  10/31/2012  *RADIOLOGY REPORT*  Clinical Data: Right knee pain  RIGHT KNEE - COMPLETE 4+ VIEW  Comparison: 09/05/2012  Findings: Irregularity along the lateral femoral condyle similar to prior.  Anterior intra-articular loose bodies again noted. Moderate joint effusion.  No definite acute fracture.  No dislocation.  IMPRESSION: Post traumatic changes and intra-articular loose bodies are similar to prior. Moderate joint effusion.   Original Report Authenticated By: Jearld Lesch, M.D.     Ice, motrin, ACE,  crutches  MDM  Acute exacerbation of chronic knee pain, evaluated with x-rays above. Treated with ice and NSAIDs. Prescription provided. Outpatient referral provided. Knee placed in Ace wrap and patient given crutches.         Sunnie Nielsen, MD 10/31/12 [phone]

## 2012-10-31 NOTE — ED Notes (Signed)
Pt w/ a history of knee problems, states stepped in a hole at work today twisting his right knee. Pt ambulated to exam room.

## 2012-11-14 ENCOUNTER — Emergency Department (HOSPITAL_COMMUNITY)
Admission: EM | Admit: 2012-11-14 | Discharge: 2012-11-14 | Disposition: A | Discharge status: Other Acute Inpatient Hospital | Payer: Self-pay | Attending: Emergency Medicine | Admitting: Emergency Medicine

## 2012-11-14 ENCOUNTER — Inpatient Hospital Stay (HOSPITAL_COMMUNITY)
Admission: AD | Admit: 2012-11-14 | Discharge: 2012-11-17 | DRG: 897 | Disposition: A | Discharge status: Home / Self Care | Payer: Federal, State, Local not specified - Other | Source: Intra-hospital | Attending: Psychiatry | Admitting: Psychiatry

## 2012-11-14 ENCOUNTER — Encounter (HOSPITAL_COMMUNITY): Payer: Self-pay | Admitting: *Deleted

## 2012-11-14 ENCOUNTER — Encounter (HOSPITAL_COMMUNITY): Payer: Self-pay

## 2012-11-14 DIAGNOSIS — M25569 Pain in unspecified knee: Secondary | ICD-10-CM | POA: Diagnosis present

## 2012-11-14 DIAGNOSIS — M549 Dorsalgia, unspecified: Secondary | ICD-10-CM | POA: Insufficient documentation

## 2012-11-14 DIAGNOSIS — F1410 Cocaine abuse, uncomplicated: Secondary | ICD-10-CM | POA: Insufficient documentation

## 2012-11-14 DIAGNOSIS — F1120 Opioid dependence, uncomplicated: Principal | ICD-10-CM | POA: Diagnosis present

## 2012-11-14 DIAGNOSIS — Z79899 Other long term (current) drug therapy: Secondary | ICD-10-CM

## 2012-11-14 DIAGNOSIS — G8929 Other chronic pain: Secondary | ICD-10-CM | POA: Diagnosis present

## 2012-11-14 DIAGNOSIS — F17200 Nicotine dependence, unspecified, uncomplicated: Secondary | ICD-10-CM | POA: Insufficient documentation

## 2012-11-14 DIAGNOSIS — F1210 Cannabis abuse, uncomplicated: Secondary | ICD-10-CM | POA: Insufficient documentation

## 2012-11-14 DIAGNOSIS — F1910 Other psychoactive substance abuse, uncomplicated: Secondary | ICD-10-CM

## 2012-11-14 DIAGNOSIS — F1110 Opioid abuse, uncomplicated: Secondary | ICD-10-CM | POA: Insufficient documentation

## 2012-11-14 DIAGNOSIS — Z8719 Personal history of other diseases of the digestive system: Secondary | ICD-10-CM | POA: Insufficient documentation

## 2012-11-14 DIAGNOSIS — Z791 Long term (current) use of non-steroidal anti-inflammatories (NSAID): Secondary | ICD-10-CM | POA: Insufficient documentation

## 2012-11-14 DIAGNOSIS — K7460 Unspecified cirrhosis of liver: Secondary | ICD-10-CM | POA: Diagnosis present

## 2012-11-14 LAB — BASIC METABOLIC PANEL
BUN: 16 mg/dL (ref 6–23)
Calcium: 9.8 mg/dL (ref 8.4–10.5)
Creatinine, Ser: 1.13 mg/dL (ref 0.50–1.35)
GFR calc non Af Amer: 85 mL/min — ABNORMAL LOW (ref >90)
Glucose, Bld: 100 mg/dL — ABNORMAL HIGH (ref 70–99)

## 2012-11-14 LAB — CBC WITH DIFFERENTIAL/PLATELET
Eosinophils Absolute: 0.2 10*3/uL (ref 0.0–0.7)
Eosinophils Relative: 2 % (ref 0–5)
Hemoglobin: 14.7 g/dL (ref 13.0–17.0)
Lymphs Abs: 3.2 10*3/uL (ref 0.7–4.0)
MCH: 30.4 pg (ref 26.0–34.0)
MCV: 90.3 fL (ref 78.0–100.0)
Monocytes Absolute: 0.7 10*3/uL (ref 0.1–1.0)
Monocytes Relative: 7 % (ref 3–12)
RBC: 4.83 MIL/uL (ref 4.22–5.81)

## 2012-11-14 LAB — RAPID URINE DRUG SCREEN, HOSP PERFORMED
Cocaine: NOT DETECTED
Opiates: NOT DETECTED

## 2012-11-14 MED ORDER — CLONIDINE HCL 0.1 MG PO TABS: 0.1000 mg | ORAL_TABLET | Freq: Every day | ORAL | Status: DC

## 2012-11-14 MED ORDER — LOPERAMIDE HCL 2 MG PO CAPS: 2.0000 mg | ORAL_CAPSULE | ORAL | Status: DC | PRN

## 2012-11-14 MED ORDER — LORAZEPAM 1 MG PO TABS
1.0000 mg | ORAL_TABLET | Freq: Four times a day (QID) | ORAL | Status: DC | PRN
  Administered 2012-11-14: 1 mg via ORAL
  Filled 2012-11-14: qty 1

## 2012-11-14 MED ORDER — PNEUMOCOCCAL VAC POLYVALENT 25 MCG/0.5ML IJ INJ
0.5000 mL | INJECTION | INTRAMUSCULAR | Status: AC
Start: 2012-11-15 — End: 2012-11-16

## 2012-11-14 MED ORDER — ONDANSETRON 4 MG PO TBDP: 4.0000 mg | ORAL_TABLET | Freq: Four times a day (QID) | ORAL | Status: DC | PRN

## 2012-11-14 MED ORDER — METHOCARBAMOL 500 MG PO TABS: 500.0000 mg | ORAL_TABLET | Freq: Three times a day (TID) | ORAL | Status: DC | PRN

## 2012-11-14 MED ORDER — CLONIDINE HCL 0.1 MG PO TABS
0.1000 mg | ORAL_TABLET | Freq: Four times a day (QID) | ORAL | Status: DC
  Administered 2012-11-15: 0.1 mg via ORAL
  Filled 2012-11-14 (×12): qty 1

## 2012-11-14 MED ORDER — CHLORDIAZEPOXIDE HCL 25 MG PO CAPS: 25.0000 mg | ORAL_CAPSULE | Freq: Four times a day (QID) | ORAL | Status: DC | PRN

## 2012-11-14 MED ORDER — HYDROXYZINE HCL 25 MG PO TABS: 25.0000 mg | ORAL_TABLET | Freq: Four times a day (QID) | ORAL | Status: DC | PRN

## 2012-11-14 MED ORDER — ALUM & MAG HYDROXIDE-SIMETH 200-200-20 MG/5ML PO SUSP: 30.0000 mL | ORAL | Status: DC | PRN

## 2012-11-14 MED ORDER — ACETAMINOPHEN 325 MG PO TABS
650.0000 mg | ORAL_TABLET | Freq: Four times a day (QID) | ORAL | Status: DC | PRN
Start: 2012-11-14 — End: 2012-11-17

## 2012-11-14 MED ORDER — TRAZODONE HCL 50 MG PO TABS
50.0000 mg | ORAL_TABLET | Freq: Every evening | ORAL | Status: DC | PRN
  Administered 2012-11-14 – 2012-11-15 (×2): 50 mg via ORAL
  Filled 2012-11-14: qty 1

## 2012-11-14 MED ORDER — MAGNESIUM HYDROXIDE 400 MG/5ML PO SUSP: 30.0000 mL | Freq: Every day | ORAL | Status: DC | PRN

## 2012-11-14 MED ORDER — CLONIDINE HCL 0.1 MG PO TABS
0.1000 mg | ORAL_TABLET | ORAL | Status: DC
  Filled 2012-11-14 (×2): qty 1

## 2012-11-14 MED ORDER — NICOTINE 14 MG/24HR TD PT24
14.0000 mg | MEDICATED_PATCH | Freq: Every day | TRANSDERMAL | Status: DC
  Administered 2012-11-15 – 2012-11-17 (×3): 14 mg via TRANSDERMAL
  Filled 2012-11-14 (×6): qty 1

## 2012-11-14 MED ORDER — DICYCLOMINE HCL 20 MG PO TABS: 20.0000 mg | ORAL_TABLET | Freq: Four times a day (QID) | ORAL | Status: DC | PRN

## 2012-11-14 NOTE — BH Assessment (Addendum)
Assessment Note   Roy [name] is an 32 y.o. male. The patient came to the ED for treatment of his substance abuse issues. He is seeking detox. He is currently on probation for possession and intent to sell. He does not want to violate his probation. He reports he has been using drugs since age 35. He was clean for awhile but he has now been using daily for the past 5 years. He is using heroin,Xanax, and opiates. He is with drawing, with complaints of nausea, sweats and chills, body aches and pains, agitation and restlessness. He denies any thought to harm himself or others. He has no history of violence. He is neither hallucinated nor delusional. His last use of Heroin and opiates was 11/06/2012. His last use of Xanax was 11/12/2012.  He has no history of seizures. Patient referred to [facility] Patient was admitted to [facility] about 5 years ago. He was IVC. He had had an argument with his girlfriend. While angry, he broke out a window. His arm was cut and this was taken as a suicide attempt, and he was therefore sent to [facility]. He denied that he was trying to hurt himself.  Axis I:  Polysubstance Abuse Axis II: Deferred Axis III:  Past Medical History  Diagnosis Date  . Cirrhosis   . Chronic knee pain   . Chronic back pain    Axis IV: economic problems, occupational problems, problems related to legal system/crime, problems with access to health care services and problems with primary support group Axis V: 41-50 serious symptoms  Past Medical History:  Past Medical History  Diagnosis Date  . Cirrhosis   . Chronic knee pain   . Chronic back pain     History reviewed. No pertinent past surgical history.  Family History: No family history on file.  Social History:  reports that he has been smoking Cigarettes.  He has been smoking about 0.50 packs per day. He does not have any smokeless tobacco history on file. He reports that he does not drink alcohol or use illicit drugs.  Additional Social  History:  Alcohol / Drug Use Pain Medications: yes Prescriptions: no Over the Counter: no History of alcohol / drug use?: Yes Longest period of sobriety (when/how long): several months Negative Consequences of Use: Legal;Financial;Personal relationships Withdrawal Symptoms: Agitation;Fever / Chills;Tremors;Irritability;Nausea / Vomiting;Patient aware of relationship between substance abuse and physical/medical complications;Sweats Substance #1 Name of Substance 1: opiates 1 - Age of First Use: 20 1 - Amount (size/oz): 10-12   10 mg pills 1 - Frequency: daily 1 - Duration: 5 years 1 - Last Use / Amount: 11/06/2012 Substance #2 Name of Substance 2: Xanax 2 - Age of First Use: 20 2 - Amount (size/oz): 4-6  1 mg pills 2 - Frequency: daily 2 - Duration: 5 years 2 - Last Use / Amount: 11/12/2012 Substance #3 Name of Substance 3: Heroin 3 - Age of First Use: 29 3 - Amount (size/oz): $20 3 - Frequency: 2 x  weekly 3 - Duration: 2 years 3 - Last Use / Amount: 11/06/2012  CIWA: CIWA-Ar BP: 114/60 mmHg Pulse Rate: 56 Nausea and Vomiting: 2 Tactile Disturbances: none Tremor: two Auditory Disturbances: very mild harshness or ability to frighten Paroxysmal Sweats: two Visual Disturbances: mild sensitivity Anxiety: moderately anxious, or guarded, so anxiety is inferred Headache, Fullness in Head: none present Agitation: two Orientation and Clouding of Sensorium: oriented and can do serial additions CIWA-Ar Total: 15 COWS: Clinical Opiate Withdrawal Scale (COWS) Resting  Pulse Rate: Pulse Rate 80 or below Sweating: No report of chills or flushing Restlessness: Able to sit still Pupil Size: Pupils pinned or normal size for room light Bone or Joint Aches: Mild diffuse discomfort Runny Nose or Tearing: Not present GI Upset: nausea or loose stool Tremor: No tremor Yawning: No yawning Anxiety or Irritability: None Gooseflesh Skin: Skin is smooth COWS Total Score: 3  Allergies:   Allergies  Allergen Reactions  . Acetaminophen Nausea And Vomiting  . Darvocet (Propoxyphene-Acetaminophen) Nausea And Vomiting  . Hydrocodone Nausea And Vomiting  . Ketorolac Tromethamine Nausea And Vomiting  . Tramadol Nausea And Vomiting    Home Medications:  (Not in a hospital admission)  OB/GYN Status:  No LMP for male patient.  General Assessment Data Location of Assessment: [facility] ACT Assessment: Yes Living Arrangements: Alone Can pt return to current living arrangement?: Yes Admission Status: Voluntary Is patient capable of signing voluntary admission?: Yes Transfer from: Acute Hospital Referral Source: MD  Education Status Is patient currently in school?: No  Risk to self Suicidal Ideation: No Suicidal Intent: No Is patient at risk for suicide?: No Suicidal Plan?: No Access to Means: No What has been your use of drugs/alcohol within the last 12 months?: daily use of xanax, opiates, and heroin Previous Attempts/Gestures: No (he was sent to [facility] after breaking a window and cutting his) How many times?: 0 Other Self Harm Risks: continued use of drugs  Triggers for Past Attempts: None known Intentional Self Injurious Behavior: None Family Suicide History: No Recent stressful life event(s): Financial Problems;Other (Comment) (afraid he will test positive and violate probatatin) Persecutory voices/beliefs?: No Depression: Yes Depression Symptoms: Isolating;Feeling worthless/self pity Substance abuse history and/or treatment for substance abuse?: Yes Suicide prevention information given to non-admitted patients: Yes  Risk to Others Homicidal Ideation: No Thoughts of Harm to Others: No Current Homicidal Intent: No Current Homicidal Plan: No Access to Homicidal Means: No History of harm to others?: No Assessment of Violence: None Noted Does patient have access to weapons?: No Criminal Charges Pending?: No Does patient have a court date:  No  Psychosis Hallucinations: None noted Delusions: None noted  Mental Status Report Appear/Hygiene: Improved Eye Contact: Good Motor Activity: Agitation;Restlessness Speech: Logical/coherent Level of Consciousness: Alert;Restless Mood: Irritable;Anxious;Depressed Affect: Anxious;Depressed;Irritable Anxiety Level: Minimal Thought Processes: Coherent;Relevant Judgement: Unimpaired Orientation: Person;Place;Time Obsessive Compulsive Thoughts/Behaviors: None  Cognitive Functioning Concentration: Normal Memory: Recent Intact;Remote Intact IQ: Average Insight: Good Impulse Control: Poor Appetite: Good Sleep: No Change Vegetative Symptoms: None  ADLScreening [facility] Assessment Services) Patient's cognitive ability adequate to safely complete daily activities?: Yes Patient able to express need for assistance with ADLs?: Yes Independently performs ADLs?: Yes (appropriate for developmental age)  Abuse/Neglect [facility]) Physical Abuse: Denies Verbal Abuse: Denies Sexual Abuse: Denies  Prior Inpatient Therapy Prior Inpatient Therapy: Yes Prior Therapy Dates: 2009 Prior Therapy Facilty/Provider(s): [facility] Reason for Treatment: IVC;cuts from breaking window,were seen as SI  Prior Outpatient Therapy Prior Outpatient Therapy: No  ADL Screening (condition at time of admission) Patient's cognitive ability adequate to safely complete daily activities?: Yes Patient able to express need for assistance with ADLs?: Yes Independently performs ADLs?: Yes (appropriate for developmental age)       Abuse/Neglect Assessment (Assessment to be complete while patient is alone) Physical Abuse: Denies Verbal Abuse: Denies Sexual Abuse: Denies Values / Beliefs Cultural Requests During Hospitalization: None Spiritual Requests During Hospitalization: None        Additional Information 1:1 In Past 12 Months?: No CIRT Risk: No  Elopement Risk: No Does patient have medical clearance?: Yes      Disposition:  Disposition Initial Assessment Completed for this Encounter: Yes Disposition of Patient: Inpatient treatment program Type of inpatient treatment program: Adult  On Site Evaluation by:   Reviewed with Physician:     Jearld Pies 11/14/2012 12:33 PM

## 2012-11-14 NOTE — Progress Notes (Addendum)
Patient ID: Roy [name], male   DOB: Nov 16, 1980, 32 y.o.   MRN: [id number] D:  32 year old Caucasian male admitted for opiate detox.  States he has been using for about five years.  Admits to [facility], oxy, heroin and others.  Has injected and snorted drugs.  Patient states that he is not really allergic to any of the medications on his allergy profile, he states that he has said he is allergic to those medicines so that they give him oxycodone when he goes to the ED.  He is currently on probation and has notified his probation officer of his whereabouts.  He is cooperative with the admission process and states he would like to go to long term rehab from here.  He currently lives with his father in a mobile home.  Both parents are supportive.  He denies depression or suicidal thoughts.  Patient also states he has been diagnosed with cirrhosis, but denies having hepatitis C.  States he was most recently tested in the past six months.   A:  Admission process completed.  Patient oriented to the unit and to the unit schedule.  He was then sent to dinner prior to completing the admission and caught up with me after he ate.  Patient was educated on what medications would be available to him for the detox process.  R:  Pleasant and cooperative with the admission process.  Has started interacting with peers on the unit.  Verbalized understanding of unit rules and schedules.

## 2012-11-14 NOTE — Tx Team (Signed)
Initial Interdisciplinary Treatment Plan  PATIENT STRENGTHS: (choose at least two) Ability for insight Active sense of humor Average or above average intelligence Capable of independent living Communication skills General fund of knowledge Motivation for treatment/growth Supportive family/friends Work skills  PATIENT STRESSORS: Financial difficulties Health problems Legal issue Substance abuse   PROBLEM LIST: Problem List/Patient Goals Date to be addressed Date deferred Reason deferred Estimated date of resolution  "I want to safely detox off of drugs and get into rehab." 14 Nov 2012                                                      DISCHARGE CRITERIA:  Ability to meet basic life and health needs Adequate post-discharge living arrangements Improved stabilization in mood, thinking, and/or behavior Medical problems require only outpatient monitoring Motivation to continue treatment in a less acute level of care Need for constant or close observation no longer present  PRELIMINARY DISCHARGE PLAN: Attend aftercare/continuing care group Attend 12-step recovery group Outpatient therapy  PATIENT/FAMIILY INVOLVEMENT: This treatment plan has been presented to and reviewed with the patient, Roy [name], and/or family member.  The patient and family have been given the opportunity to ask questions and make suggestions.  [name] 11/14/2012, 7:53 PM

## 2012-11-14 NOTE — ED Notes (Signed)
Meal Tray given. Pt tolerating well.

## 2012-11-14 NOTE — ED Notes (Signed)
Pt states he wants to get off xanax and percocet, other pain meds that he can get.  Pt last used xanax yesterday and percocet last week, denies SI/HI

## 2012-11-14 NOTE — ED Provider Notes (Signed)
History     CSN: [id number]  Arrival date & time 11/14/12  0050   First MD Initiated Contact with Patient 11/14/12 0145      Chief Complaint  Patient presents with  . V70.1    (Consider location/radiation/quality/duration/timing/severity/associated sxs/prior treatment) HPI.... patient admits to excessive opiate and benzodiazepine usage for 5 years.  He takes Xanax 1 mg 4-6 times per day and Percocet 10-15 tablets a day. He is seeking treatment for his problem. No suicidal or homicidal ideation. Severity is moderate . No chest pain, dyspnea, neuro deficits, fever, chills.  Past Medical History  Diagnosis Date  . Cirrhosis   . Chronic knee pain   . Chronic back pain     History reviewed. No pertinent past surgical history.  No family history on file.  History  Substance Use Topics  . Smoking status: Current Some Day Smoker -- 0.50 packs/day    Types: Cigarettes  . Smokeless tobacco: Not on file  . Alcohol Use: No      Review of Systems  All other systems reviewed and are negative.    Allergies  Acetaminophen; Darvocet; Hydrocodone; Ketorolac tromethamine; and Tramadol  Home Medications   Current Outpatient Rx  Name  Route  Sig  Dispense  Refill  . famotidine (PEPCID) 20 MG tablet   Oral   Take 1 tablet (20 mg total) by mouth 2 (two) times daily.   30 tablet   0   . ibuprofen (ADVIL,MOTRIN) 200 MG tablet   Oral   Take 400-600 mg by mouth daily as needed for pain.          Marland Kitchen ibuprofen (ADVIL,MOTRIN) 800 MG tablet   Oral   Take 1 tablet (800 mg total) by mouth 3 (three) times daily.   21 tablet   0   . naproxen (NAPROSYN) 500 MG tablet   Oral   Take 1 tablet (500 mg total) by mouth 2 (two) times daily with a meal.   20 tablet   0     BP 136/80  Pulse 88  Temp(Src) 98.1 F (36.7 C) (Oral)  Resp 16  Ht 5\' 9"  (1.753 m)  Wt 180 lb (81.647 kg)  BMI 26.57 kg/m2  SpO2 100%  Physical Exam  Nursing note and vitals reviewed. Constitutional: He  is oriented to person, place, and time. He appears well-developed and well-nourished.  HENT:  Head: Normocephalic and atraumatic.  Eyes: Conjunctivae and EOM are normal. Pupils are equal, round, and reactive to light.  Neck: Normal range of motion. Neck supple.  Cardiovascular: Normal rate, regular rhythm and normal heart sounds.   Pulmonary/Chest: Effort normal and breath sounds normal.  Abdominal: Soft. Bowel sounds are normal.  Musculoskeletal: Normal range of motion.  Neurological: He is alert and oriented to person, place, and time.  Skin: Skin is warm and dry.  Psychiatric: He has a normal mood and affect.    ED Course  Procedures (including critical care time)  Labs Reviewed  BASIC METABOLIC PANEL - Abnormal; Notable for the following:    Glucose, Bld 100 (*)    GFR calc non Af Amer 85 (*)    All other components within normal limits  URINE RAPID DRUG SCREEN (HOSP PERFORMED)  CBC WITH DIFFERENTIAL  ETHANOL   No results found.   No diagnosis found.    MDM  Patient has problem with polysubstance abuse. Will get behavioral health consultation       Donnetta Hutching, MD 11/14/12 [phone]

## 2012-11-14 NOTE — Progress Notes (Signed)
Pt attended AA group in its entirety.  

## 2012-11-15 ENCOUNTER — Encounter (HOSPITAL_COMMUNITY): Payer: Self-pay | Admitting: Psychiatry

## 2012-11-15 DIAGNOSIS — F1120 Opioid dependence, uncomplicated: Principal | ICD-10-CM

## 2012-11-15 DIAGNOSIS — F1994 Other psychoactive substance use, unspecified with psychoactive substance-induced mood disorder: Secondary | ICD-10-CM

## 2012-11-15 NOTE — Tx Team (Signed)
Interdisciplinary Treatment Plan Update (Adult)  Date: 11/15/2012  Time Reviewed: 9:48 AM   Progress in Treatment: Attending groups: Yes Participating in groups: Yes Taking medication as prescribed:  Yes Tolerating medication:  Yes Family/Significant othe contact made: No Patient understands diagnosis: Yes Discussing patient identified problems/goals with staff: Yes Medical problems stabilized or resolved:  Yes Denies suicidal/homicidal ideation: Yes Patient has not harmed self or Others: Yes  New problem(s) identified: None Identified  Discharge Plan or Barriers:  CSW is assessing for appropriate referrals. Patient interested in treatment program  Additional comments: N/A  Reason for Continuation of Hospitalization:  Medication stabilization Withdrawal Sypmtoms  Estimated length of stay: 3-5 days  For review of initial/current patient goals, please see plan of care.  Attendees: Patient:     Family:     Physician:  Geoffery Lyons 11/15/2012 9:48 AM   Nursing:   Robbie Louis, RN 11/15/2012 9:48 AM   Clinical Social Worker Ronda Fairly 11/15/2012 9:48 AM   Other:  Liliane Bade, Community Care Coordinator 11/15/2012 9:48 AM   Other:  Harold Barban, RN 11/15/2012 9:48 AM   Other:  Serena Colonel, NP 11/15/2012 9:48 AM       Scribe for Treatment Team:   Carney Bern, LCSWA  11/15/2012 9:48 AM

## 2012-11-15 NOTE — [facility] Suicide Risk Assessment (Signed)
Suicide Risk Assessment  Admission Assessment     Nursing information obtained from:  Patient Demographic factors:  Male Current Mental Status:    Loss Factors:  Legal issues Historical Factors:    Risk Reduction Factors:  Sense of responsibility to family;Living with another person, especially a relative;Positive therapeutic relationship;Positive coping skills or problem solving skills  CLINICAL FACTORS:   Alcohol/Substance Abuse/Dependencies  COGNITIVE FEATURES THAT CONTRIBUTE TO RISK:  Closed-mindedness Thought constriction (tunnel vision)    SUICIDE RISK:   Moderate:  Frequent suicidal ideation with limited intensity, and duration, some specificity in terms of plans, no associated intent, good self-control, limited dysphoria/symptomatology, some risk factors present, and identifiable protective factors, including available and accessible social support.  PLAN OF CARE: Supportive approach/coping skills/relapse prevention                               Clonidine Detox protocol                               Reassess the comorbidities  I certify that inpatient services furnished can reasonably be expected to improve the patient's condition.  Alishea Beaudin A 11/15/2012, 3:15 PM
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
## 2012-11-15 NOTE — Progress Notes (Signed)
Patient ID: Roy [name], male   DOB: 06-20-1981, 32 y.o.   MRN: [id number]   D: Pt was slightly anxious. Complained of decreased sleep, stating that "he can't sleep because things are always on his mind".  When asked the circumstances surrounding his adm pt stated, "it's been itching my mind for about a week now". States he hasn't had anything referring to substances since Sunday. Pt voiced no questions or concerns.  A:  Support and encouragement was offered. 15 min checks continued for safety.  R: Pt remains safe.

## 2012-11-15 NOTE — [facility] Suicide Risk Assessment (Signed)
Suicide Risk Assessment  Admission Assessment     Nursing information obtained from:  Patient Demographic factors:  Male Current Mental Status:    Loss Factors:  Legal issues Historical Factors:    Risk Reduction Factors:  Sense of responsibility to family;Living with another person, especially a relative;Positive therapeutic relationship;Positive coping skills or problem solving skills  CLINICAL FACTORS:   Alcohol/Substance Abuse/Dependencies  COGNITIVE FEATURES THAT CONTRIBUTE TO RISK:  Closed-mindedness Polarized thinking Thought constriction (tunnel vision)    SUICIDE RISK:   Moderate:  Frequent suicidal ideation with limited intensity, and duration, some specificity in terms of plans, no associated intent, good self-control, limited dysphoria/symptomatology, some risk factors present, and identifiable protective factors, including available and accessible social support.  PLAN OF CARE: Supportive approach/coping skills/relapse prevention                               Reassess and address the co morbidities  I certify that inpatient services furnished can reasonably be expected to improve the patient's condition.  Daunte Oestreich A 11/15/2012, 3:23 PM

## 2012-11-15 NOTE — Progress Notes (Signed)
Pt reports his sleep as fair even after taking medication.  His appetite is improving energy low and ability to pay attention as good.  He rated his his depression a 0 hopelessness 0 and denied any anxiety on his self-inventory.  He denies any S/H ideation. He has refused his clonidine today except the 0800 dose.  Pt stated,"I really don't think I need that clonidine but I will let you know if I do" he went on to say that if he had any other symptoms he would let staff know.  He is hoping to get into [facility] from here.

## 2012-11-15 NOTE — Progress Notes (Signed)
D: Patient at the medication window for his HS medications. He endorsed a good day, although mood and affects sad and depressed. Patient denied SI/HI and denied hallucinations.  A: Writer encouraged and supported patient. Offered and administered HS medications as scheduled. R: Patient receptive to encouragement and support. He refused his HS Scheduled dose of clonidine. Q 15 minute check continues as ordered to maintain safety.

## 2012-11-15 NOTE — [facility] Group Notes (Signed)
[facility] LCSW Group Therapy   11/15/2012 1:15 PM   Type of Therapy: Group Therapy 1:15 to 2:30   Participation Level: Attentive   Participation Quality: Developing   Affect: Flat   Cognitive: Alert and attentive   Insight: Limited   Engagement in Therapy: Limited   Modes of Intervention: Discussion, rapport building, exploration and support   Summary of Progress/Problems: The focus of this group session was to process how we deal with difficult emotions and share with others the patterns that play out when we are reacting to the emotion verses the situation. Patient identified with wanting to numb emotions, especially negative emotions.   Roy [name]
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
## 2012-11-15 NOTE — [facility] Group Notes (Signed)
[facility] LCSW Aftercare Discharge Planning Group Note   11/15/2012 8:45 AM  Participation Quality:  Appropriate  Mood/Affect:  Appropriate and Flat  Depression Rating:  0  Anxiety Rating:  0  Thoughts of Suicide:  No Will you contract for safety?   NA  Current AVH:  No  Plan for Discharge/Comments:  Patient is here voluntarily from [facility] and requesting referral to inpatient program  Transportation Means: Family, treatment center, uncertain  Supports: Both parents and extended family  Dyane Dustman, Julious Payer

## 2012-11-16 DIAGNOSIS — F19239 Other psychoactive substance dependence with withdrawal, unspecified: Secondary | ICD-10-CM

## 2012-11-16 MED ORDER — TRAZODONE HCL 100 MG PO TABS
100.0000 mg | ORAL_TABLET | Freq: Every evening | ORAL | Status: DC | PRN
  Administered 2012-11-16: 100 mg via ORAL

## 2012-11-16 NOTE — Progress Notes (Signed)
[facility] MD Progress Note  11/16/2012 3:43 PM Roy [name]  MRN:  [id number] Subjective:  States that the detox is not going as bad as he thought. States that his family is proud he is doing something about his addiction. He endorses some anxiety and some difficulty with sleep Diagnosis:  Opioid Dependence/withdrwall  ADL's:  Intact  Sleep: Poor  Appetite:  Poor  Suicidal Ideation:  Plan:  denies Intent:  denies Means:  denies Homicidal Ideation:  Plan:  denies Intent:  denies Means:  denies AEB (as evidenced by):  Psychiatric Specialty Exam: Review of Systems  Constitutional: Negative.   HENT: Negative.   Eyes: Negative.   Respiratory: Negative.   Cardiovascular: Negative.   Gastrointestinal: Negative.   Genitourinary: Negative.   Musculoskeletal: Negative.   Skin: Negative.   Neurological: Negative.   Endo/Heme/Allergies: Negative.   Psychiatric/Behavioral: Positive for substance abuse. The patient is nervous/anxious.     Blood pressure 111/72, pulse 98, temperature 97.8 F (36.6 C), temperature source Oral, resp. rate 16, weight 77.111 kg (170 lb).Body mass index is 25.09 kg/(m^2).  General Appearance: Fairly Groomed  Patent attorney::  Minimal  Speech:  Clear and Coherent, Slow and not spontaneous  Volume:  Decreased  Mood:  Anxious  Affect:  Restricted  Thought Process:  Coherent and Goal Directed  Orientation:  Full (Time, Place, and Person)  Thought Content:  Rumination  Suicidal Thoughts:  No  Homicidal Thoughts:  No  Memory:  Immediate;   Fair Recent;   Fair Remote;   Fair  Judgement:  Fair  Insight:  Present  Psychomotor Activity:  Restlessness  Concentration:  Fair  Recall:  Fair  Akathisia:  No  Handed:  Right  AIMS (if indicated):     Assets:  Desire for Improvement  Sleep:  Number of Hours: 5.75   Current Medications: Current Facility-Administered Medications  Medication Dose Route Frequency Provider Last Rate Last Dose  . acetaminophen  (TYLENOL) tablet 650 mg  650 mg Oral Q6H PRN Fransisca Kaufmann, NP      . alum & mag hydroxide-simeth (MAALOX/MYLANTA) 200-200-20 MG/5ML suspension 30 mL  30 mL Oral Q4H PRN Fransisca Kaufmann, NP      . chlordiazePOXIDE (LIBRIUM) capsule 25 mg  25 mg Oral QID PRN Kerry Hough, PA-C      . cloNIDine (CATAPRES) tablet 0.1 mg  0.1 mg Oral QID Kerry Hough, PA-C   0.1 mg at 11/15/12 4010   Followed by  . [START ON 11/17/2012] cloNIDine (CATAPRES) tablet 0.1 mg  0.1 mg Oral BH-qamhs Spencer E Simon, PA-C       Followed by  . [START ON 11/20/2012] cloNIDine (CATAPRES) tablet 0.1 mg  0.1 mg Oral QAC breakfast Kerry Hough, PA-C      . dicyclomine (BENTYL) tablet 20 mg  20 mg Oral Q6H PRN Kerry Hough, PA-C      . hydrOXYzine (ATARAX/VISTARIL) tablet 25 mg  25 mg Oral Q6H PRN Kerry Hough, PA-C      . loperamide (IMODIUM) capsule 2-4 mg  2-4 mg Oral PRN Kerry Hough, PA-C      . magnesium hydroxide (MILK OF MAGNESIA) suspension 30 mL  30 mL Oral Daily PRN Fransisca Kaufmann, NP      . methocarbamol (ROBAXIN) tablet 500 mg  500 mg Oral Q8H PRN Kerry Hough, PA-C      . nicotine (NICODERM CQ - dosed in mg/24 hours) patch 14 mg  14 mg Transdermal Daily Madie Reno  Jorja Loa, MD   14 mg at 11/16/12 0620  . ondansetron (ZOFRAN-ODT) disintegrating tablet 4 mg  4 mg Oral Q6H PRN Kerry Hough, PA-C      . traZODone (DESYREL) tablet 100 mg  100 mg Oral QHS PRN,MR X 1 Rachael Fee, MD        Lab Results: No results found for this or any previous visit (from the past 48 hour(s)).  Physical Findings: AIMS: Facial and Oral Movements Muscles of Facial Expression: None, normal Lips and Perioral Area: None, normal Jaw: None, normal Tongue: None, normal,Extremity Movements Upper (arms, wrists, hands, fingers): None, normal Lower (legs, knees, ankles, toes): None, normal, Trunk Movements Neck, shoulders, hips: None, normal, Overall Severity Severity of abnormal movements (highest score from questions above): None,  normal Incapacitation due to abnormal movements: None, normal Patient's awareness of abnormal movements (rate only patient's report): No Awareness, Dental Status Current problems with teeth and/or dentures?: No Does patient usually wear dentures?: No  CIWA:  CIWA-Ar Total: 0 COWS:  COWS Total Score: 0  Treatment Plan Summary: Daily contact with patient to assess and evaluate symptoms and progress in treatment Medication management  Plan: Supportive approach/coping skills/relapse prevention           Continue clonidine detox           Reassess and address the comorbidities  Medical Decision Making Problem Points:  Review of psycho-social stressors (1) Data Points:  Review of medication regiment & side effects (2) Review of new medications or change in dosage (2)  I certify that inpatient services furnished can reasonably be expected to improve the patient's condition.   Tiane Szydlowski A 11/16/2012, 3:43 PM

## 2012-11-16 NOTE — Progress Notes (Signed)
Patient ID: Roy [name], male   DOB: February 15, 1981, 31 y.o.   MRN: [id number] CSW made call to probation officer as per patient's request. Call was made to Trinda Pascal at [phone] ext [phone], message was left as Ms Chilton Si was out of office.  Carney Bern, LCSWA

## 2012-11-16 NOTE — Progress Notes (Signed)
Adult Psychoeducational Group Note  Date:  11/16/2012 Time:  2:00 PM  Group Topic/Focus:  Overcoming Stress:   The focus of this group is to define stress and help patients assess their triggers.  Participation Level:  Active  Participation Quality:  Appropriate, Attentive, Sharing and Supportive  Affect:  Appropriate  Cognitive:  Alert and Appropriate  Insight: Appropriate  Engagement in Group:  Engaged  Modes of Intervention:  Discussion and Education  Additional Comments:  Pt attended group.  Shelly Bombard D 11/16/2012, 2:00 PM

## 2012-11-16 NOTE — [facility] Group Notes (Signed)
[facility] LCSW Group Therapy  11/16/2012 1:15 PM  Type of Therapy:  Group Therapy 1:15 to 2:30 PM  Participation Level:  Active  Participation Quality:  Appropriate  Affect:  Appropriate  Cognitive:  Appropriate  Insight:  Developing  Engagement in Therapy:  Engaged  Modes of Intervention:  Discussion, Exploration, Rapport Building, Socialization and Support  Summary of Progress/Problems:Focus of group processing discussion was on balance in life; the components in life which have a negative influence on balance and the components which make for a more balanced life.Roy [name] shared that hobbies like fishing alone or with friends are important to him.  He also shared that being in a large city or flying on plane would feel uncomforatable for him.  Clide Dales

## 2012-11-16 NOTE — Progress Notes (Signed)
Recreation Therapy Notes  Date: 05.29.2014 Time: 3:00pm  Location: [address] Dayroom  Group Topic/Focus: Stress Managment   Participation Level:  Active   Participation Quality:  Appropriate   Affect:  Euthymic   Cognitive:  Appropriate   Additional Comments: Activity: Guided Imagery & Progressive Muscle Relaxation; Explanation: Patients listened to Guided Imagery Script. LRT instructed patients on completing Progressive Muscle Relaxation.  Patient actively participated in both relaxation techniques. Patient educated on benefits of stress management. Patient listened in group discussion about when and where stress management can be used.   Jearl Klinefelter, LRT/ CTRS  Jearl Klinefelter 11/16/2012 4:25 PM

## 2012-11-16 NOTE — [facility] Group Notes (Signed)
[facility] LCSW Aftercare Discharge Planning Group Note   11/16/2012 8:45 AM  Participation Quality:  Appropriate  Mood/Affect:  Appropriate   Depression Rating:  0  Anxiety Rating:  0  Thoughts of Suicide:  No Will you contract for safety?   NA  Current AVH:  No  Plan for Discharge/Comments:  Patient is here voluntarily from [facility] and requesting referral to inpatient program  Transportation Means: Family, treatment center, uncertain  Supports: Both parents and extended family  Dyane Dustman, Julious Payer

## 2012-11-16 NOTE — Progress Notes (Signed)
D: Patient in hall during this assessment mood and affects appropriate. He reported having a good day. Denied SI/HI and denied hallucinations.  A: Writer encouraged and supported patient. Asked patient why he has been refusing his clonidine. R: Patient stated; "I didn't like how the medication made me feel the other day I took it". Patient receptive to encouragement and support. Q 15 minute check continues as ordered to maintain safety.

## 2012-11-17 MED ORDER — FAMOTIDINE 20 MG PO TABS: 20.0000 mg | ORAL_TABLET | Freq: Two times a day (BID) | ORAL | Status: DC

## 2012-11-17 MED ORDER — TRAZODONE HCL 100 MG PO TABS: 100.0000 mg | ORAL_TABLET | Freq: Every evening | ORAL | Status: DC | PRN

## 2012-11-17 MED ORDER — CYANOCOBALAMIN 1000 MCG/ML IJ SOLN
1000.0000 ug | Freq: Once | INTRAMUSCULAR | Status: AC
  Administered 2012-11-17: 1000 ug via INTRAMUSCULAR
  Filled 2012-11-17: qty 1

## 2012-11-17 MED ORDER — IBUPROFEN 200 MG PO TABS: 400.0000 mg | ORAL_TABLET | Freq: Every day | ORAL | Status: DC | PRN

## 2012-11-17 NOTE — Progress Notes (Signed)
Patient did attend the evening karaoke group. Pt was attentive and supportive.   

## 2012-11-17 NOTE — Progress Notes (Signed)
Patient discharged today to [facility], no meds, no scripts, signed release of information, was given discharge instructions, denies SI or HI and no AVH. [facility] rep here to pick up patient and transport. No complaints, all belongings given back to patient and signed for.

## 2012-11-17 NOTE — [facility] Group Notes (Signed)
[facility] LCSW Aftercare Discharge Planning Group Note   11/17/2012  8:45 AM  Participation Quality:  Appropriate   Mood/Affect:  Appropriate  Depression Rating:  0  Anxiety Rating:  0  Thoughts of Suicide:  No Will you contract for safety?   NA  Current AVH:  No  Plan for Discharge/Comments:  [facility] will pick up today  Transportation Means: [facility]  Supports: Family and [facility]  [place], Julious Payer
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
## 2012-11-17 NOTE — [facility] Suicide Risk Assessment (Signed)
Suicide Risk Assessment  Discharge Assessment     Demographic Factors:  Male and Caucasian  Mental Status Per Nursing Assessment::   On Admission:     Current Mental Status by Physician: In full contact with reality. There are no suicidal ideas, plans or intent. His mood is euthymic, affect is appropriate. He is willing and motivated to pursue further rehab. Will go to [facility] today   Loss Factors: Financial problems/change in socioeconomic status  Historical Factors: NA  Risk Reduction Factors:   Living with another person, especially a relative and Positive social support  Continued Clinical Symptoms:  Alcohol/Substance Abuse/Dependencies  Cognitive Features That Contribute To Risk:  Closed-mindedness Thought constriction (tunnel vision)    Suicide Risk:  Minimal: No identifiable suicidal ideation.  Patients presenting with no risk factors but with morbid ruminations; may be classified as minimal risk based on the severity of the depressive symptoms  Discharge Diagnoses:   AXIS I:  Opioid Dependence/withdrawal AXIS II:  Deferred AXIS III:   Past Medical History  Diagnosis Date  . Cirrhosis   . Chronic knee pain   . Chronic back pain    AXIS IV:  economic problems and other psychosocial or environmental problems AXIS V:  61-70 mild symptoms  Plan Of Care/Follow-up recommendations:  Activity:  as tolerated Diet:  regular To be admitted to [facility] Is patient on multiple antipsychotic therapies at discharge:  No   Has Patient had three or more failed trials of antipsychotic monotherapy by history:  No  Recommended Plan for Multiple Antipsychotic Therapies: N/A   Whittaker Lenis A 11/17/2012, 8:34 AM

## 2012-11-17 NOTE — Progress Notes (Signed)
[facility] Adult Case Management Discharge Plan :  Will you be returning to the same living situation after discharge: No. Patient transferring to [facility] for SA treatment At discharge, do you have transportation home?:Yes,  [facility] Do you have the ability to pay for your medications:No. No medicines prescribed  Release of information consent forms completed and in the chart;  Patient's signature needed at discharge.  Patient to Follow up at: Follow-up Information   Follow up with [facility] On 11/17/2012. ([facility] will transport patient from [facility] to [facility] morning of Friday 11/17/12)    Contact information:   [address] [place], [place] [address] [facility] [phone] Valinda Hoar [phone]      Patient denies SI/HI:   Yes,  denies both    Safety Planning and Suicide Prevention discussed:  No. Not applicable as SI was never a oart of patient's presentation.   Clide Dales 11/17/2012, 5:28 PM

## 2012-11-21 NOTE — Progress Notes (Signed)
Patient Discharge Instructions:  After Visit Summary (AVS):   Faxed to:  11/21/12 Discharge Summary Note:   Faxed to:  11/21/12 Psychiatric Admission Assessment Note:   Faxed to:  11/21/12 Suicide Risk Assessment - Discharge Assessment:   Faxed to:  11/21/12 Faxed/Sent to the Next Level Care provider:  11/21/12 Faxed to [facility] @ [phone]  Jerelene Redden, 11/21/2012, 3:34 PM

## 2013-07-18 ENCOUNTER — Encounter (HOSPITAL_COMMUNITY): Payer: Self-pay | Admitting: Emergency Medicine

## 2013-07-18 ENCOUNTER — Emergency Department (HOSPITAL_COMMUNITY)
Admission: EM | Admit: 2013-07-18 | Discharge: 2013-07-18 | Disposition: A | Discharge status: Home / Self Care | Payer: BC Managed Care – PPO | Attending: Emergency Medicine | Admitting: Emergency Medicine

## 2013-07-18 DIAGNOSIS — Z8719 Personal history of other diseases of the digestive system: Secondary | ICD-10-CM | POA: Insufficient documentation

## 2013-07-18 DIAGNOSIS — F17200 Nicotine dependence, unspecified, uncomplicated: Secondary | ICD-10-CM | POA: Insufficient documentation

## 2013-07-18 DIAGNOSIS — J069 Acute upper respiratory infection, unspecified: Secondary | ICD-10-CM | POA: Insufficient documentation

## 2013-07-18 DIAGNOSIS — G8929 Other chronic pain: Secondary | ICD-10-CM | POA: Insufficient documentation

## 2013-07-18 DIAGNOSIS — Z79899 Other long term (current) drug therapy: Secondary | ICD-10-CM | POA: Insufficient documentation

## 2013-07-18 MED ORDER — BENZONATATE 100 MG PO CAPS: 100.0000 mg | ORAL_CAPSULE | Freq: Three times a day (TID) | ORAL | Status: DC

## 2013-07-18 MED ORDER — AZITHROMYCIN 250 MG PO TABS
500.0000 mg | ORAL_TABLET | Freq: Once | ORAL | Status: AC
  Administered 2013-07-18: 500 mg via ORAL
  Filled 2013-07-18: qty 2

## 2013-07-18 MED ORDER — PREDNISONE 20 MG PO TABS: 40.0000 mg | ORAL_TABLET | Freq: Every day | ORAL | Status: AC

## 2013-07-18 MED ORDER — AZITHROMYCIN 250 MG PO TABS: ORAL_TABLET | ORAL | Status: DC

## 2013-07-18 MED ORDER — PREDNISONE 50 MG PO TABS
60.0000 mg | ORAL_TABLET | ORAL | Status: AC
  Administered 2013-07-18: 60 mg via ORAL
  Filled 2013-07-18 (×2): qty 1

## 2013-07-18 NOTE — ED Notes (Signed)
Patient evaluated by EDP prior to nursing assessment.

## 2013-07-18 NOTE — ED Notes (Signed)
Pt alert & oriented x4, stable gait. Patient given discharge instructions, paperwork & prescription(s). Patient  instructed to stop at the registration desk to finish any additional paperwork. Patient verbalized understanding. Pt left department w/ no further questions. 

## 2013-07-18 NOTE — ED Provider Notes (Signed)
CSN: [id number]     Arrival date & time 07/18/13  2046 History   This chart was scribed for Gerhard Munch, MD by Dorothey Baseman, ED Scribe. This patient was seen in room APA01/APA01 and the patient's care was started at 10:54 PM.    Chief Complaint  Patient presents with  . Cough   The history is provided by the patient. No language interpreter was used.   HPI Comments: Roy [name] is a 33 y.o. male who presents to the Emergency Department complaining of an intermittent, dry cough with associated sore throat, congestion, fatigue, and diffuse myalgias onset about 3 weeks ago that has been progressively worsening over the past few days. He reports taking DayQuil at home with mild, temporary relief. He denies fever, nausea, shortness of breath. Patient has a history of cirrhosis. Patient states that he has cut back significantly on smoking and only has 3-4 cigarettes per day.   Past Medical History  Diagnosis Date  . Cirrhosis   . Chronic knee pain   . Chronic back pain    History reviewed. No pertinent past surgical history. History reviewed. No pertinent family history. History  Substance Use Topics  . Smoking status: Current Some Day Smoker -- 0.50 packs/day for 18 years    Types: Cigarettes  . Smokeless tobacco: Never Used  . Alcohol Use: No    Review of Systems  Constitutional: Positive for fatigue. Negative for fever.       Per HPI, otherwise negative  HENT: Positive for congestion and sore throat.        Per HPI, otherwise negative  Respiratory: Positive for cough. Negative for shortness of breath.        Per HPI, otherwise negative  Cardiovascular:       Per HPI, otherwise negative  Gastrointestinal: Negative for nausea and vomiting.  Endocrine:       Negative aside from HPI  Genitourinary:       Neg aside from HPI   Musculoskeletal: Positive for myalgias.       Per HPI, otherwise negative  Skin: Negative.   Neurological: Negative for syncope.    Allergies   Acetaminophen; Darvocet; Ketorolac tromethamine; and Tramadol  Home Medications   Current Outpatient Rx  Name  Route  Sig  Dispense  Refill  . famotidine (PEPCID) 20 MG tablet   Oral   Take 1 tablet (20 mg total) by mouth 2 (two) times daily. For acid reflux   30 tablet   0   . ibuprofen (ADVIL,MOTRIN) 200 MG tablet   Oral   Take 2-3 tablets (400-600 mg total) by mouth daily as needed for pain.   30 tablet   0   . traZODone (DESYREL) 100 MG tablet   Oral   Take 1 tablet (100 mg total) by mouth at bedtime as needed and may repeat dose one time if needed for sleep. For sleep   60 tablet   0    Triage Vitals: BP 129/73  Pulse 109  Temp(Src) 98.4 F (36.9 C) (Oral)  Resp 20  Ht 5\' 9"  (1.753 m)  Wt 170 lb (77.111 kg)  BMI 25.09 kg/m2  SpO2 97%  Physical Exam  Nursing note and vitals reviewed. Constitutional: He is oriented to person, place, and time. He appears well-developed. No distress.  HENT:  Head: Normocephalic and atraumatic.  Mouth/Throat: No oropharyngeal exudate.  Mild pharyngeal erythema. Tonsils are symmetric. Uvula is midline.   Eyes: Conjunctivae and EOM are normal.  Cardiovascular:  Normal rate and regular rhythm.   Pulmonary/Chest: Effort normal and breath sounds normal. No stridor. No respiratory distress.  Abdominal: He exhibits no distension.  Musculoskeletal: He exhibits no edema.  Neurological: He is alert and oriented to person, place, and time.  Skin: Skin is warm and dry.  Psychiatric: He has a normal mood and affect.    ED Course  Procedures (including critical care time)  COORDINATION OF CARE: 10:58 PM- Advised patient to quit smoking and the long-term effects of smoking. Discussed that symptoms are likely due to bronchitis. Will discharge patient with antibiotics, cough suppressant, and steroids to manage symptoms. Discussed treatment plan with patient at bedside and patient verbalized agreement.    (I discussed smoking cessation with  the patient for 5 minutes) Labs Review Labs Reviewed - No data to display Imaging Review No results found.  EKG Interpretation   None       MDM   1. URI (upper respiratory infection)     I personally performed the services described in this documentation, which was scribed in my presence. The recorded information has been reviewed and is accurate.   This young male with history of smoking presents with ongoing URI like symptoms.  With his smoking history, the duration of symptoms, he was started on both steroids and antibiotics.  More importantly, the patient had counseling on smoking cessation. Absent distress, fever, and with provision of antibiotics, imaging was not indicated.  Patient discharged in stable condition  Gerhard Munchobert Shelley Cocke, MD 07/18/13 2304

## 2013-07-18 NOTE — Discharge Instructions (Signed)
As discussed, you have a respiratory infection.  In addition to the medications, please continue to pursue smoking cessation.  Please follow up with your primary care physician, return here for concerning changes in your condition.

## 2013-07-18 NOTE — ED Notes (Signed)
PT C/O RUNNY NOSE, COUGH, AND BODYACHES X 3 WKS.

## 2013-09-27 ENCOUNTER — Encounter (HOSPITAL_COMMUNITY): Payer: Self-pay | Admitting: Emergency Medicine

## 2013-09-27 ENCOUNTER — Emergency Department (HOSPITAL_COMMUNITY)
Admission: EM | Admit: 2013-09-27 | Discharge: 2013-09-27 | Disposition: A | Discharge status: Home / Self Care | Payer: BC Managed Care – PPO | Attending: Emergency Medicine | Admitting: Emergency Medicine

## 2013-09-27 DIAGNOSIS — R197 Diarrhea, unspecified: Secondary | ICD-10-CM | POA: Insufficient documentation

## 2013-09-27 DIAGNOSIS — B349 Viral infection, unspecified: Secondary | ICD-10-CM

## 2013-09-27 DIAGNOSIS — R059 Cough, unspecified: Secondary | ICD-10-CM

## 2013-09-27 DIAGNOSIS — R112 Nausea with vomiting, unspecified: Secondary | ICD-10-CM | POA: Insufficient documentation

## 2013-09-27 DIAGNOSIS — G8929 Other chronic pain: Secondary | ICD-10-CM | POA: Insufficient documentation

## 2013-09-27 DIAGNOSIS — F17200 Nicotine dependence, unspecified, uncomplicated: Secondary | ICD-10-CM | POA: Insufficient documentation

## 2013-09-27 DIAGNOSIS — B9789 Other viral agents as the cause of diseases classified elsewhere: Secondary | ICD-10-CM | POA: Insufficient documentation

## 2013-09-27 DIAGNOSIS — Z8719 Personal history of other diseases of the digestive system: Secondary | ICD-10-CM | POA: Insufficient documentation

## 2013-09-27 DIAGNOSIS — R05 Cough: Secondary | ICD-10-CM

## 2013-09-27 DIAGNOSIS — J029 Acute pharyngitis, unspecified: Secondary | ICD-10-CM | POA: Insufficient documentation

## 2013-09-27 DIAGNOSIS — R42 Dizziness and giddiness: Secondary | ICD-10-CM | POA: Insufficient documentation

## 2013-09-27 MED ORDER — ALBUTEROL SULFATE HFA 108 (90 BASE) MCG/ACT IN AERS
2.0000 | INHALATION_SPRAY | Freq: Once | RESPIRATORY_TRACT | Status: AC
  Administered 2013-09-27: 2 via RESPIRATORY_TRACT
  Filled 2013-09-27: qty 6.7

## 2013-09-27 NOTE — Discharge Instructions (Signed)
Cough, Adult  A cough is a reflex. It helps you clear your throat and airways. A cough can help heal your body. A cough can last 2 or 3 weeks (acute) or may last more than 8 weeks (chronic). Some common causes of a cough can include an infection, allergy, or a cold. HOME CARE  Only take medicine as told by your doctor.  If given, take your medicines (antibiotics) as told. Finish them even if you start to feel better.  Use a cold steam vaporizer or humidier in your home. This can help loosen thick spit (secretions).  Sleep so you are almost sitting up (semi-upright). Use pillows to do this. This helps reduce coughing.  Rest as needed.  Stop smoking if you smoke. GET HELP RIGHT AWAY IF:  You have yellowish-white fluid (pus) in your thick spit.  Your cough gets worse.  Your medicine does not reduce coughing, and you are losing sleep.  You cough up blood.  You have trouble breathing.  Your pain gets worse and medicine does not help.  You have a fever. MAKE SURE YOU:   Understand these instructions.  Will watch your condition.  Will get help right away if you are not doing well or get worse. Document Released: 02/18/2011 Document Revised: 08/30/2011 Document Reviewed: 02/18/2011 Exit[employer] Patient Information 2014 [employer].  

## 2013-09-27 NOTE — ED Notes (Signed)
Patient c/o runny nose, cough and generalized body aches x 3 days.

## 2013-09-27 NOTE — ED Provider Notes (Signed)
CSN: [id number]     Arrival date & time 09/27/13  2113 History  This chart was scribed for Joya Gaskins, MD by Bennett Scrape, ED Scribe. This patient was seen in room APFT23/APFT23 and the patient's care was started at 9:57 PM.   Chief Complaint  Patient presents with  . Nasal Congestion     Patient is a 33 y.o. male presenting with cough. The history is provided by the patient. No language interpreter was used.  Cough Cough characteristics:  Non-productive Severity:  Mild Onset quality:  Gradual Duration:  3 days Chronicity:  New Smoker: yes   Associated symptoms: chills, headaches, myalgias, sinus congestion and sore throat   Associated symptoms: no shortness of breath     HPI Comments: Roy [name] is a 33 y.o. male who presents to the Emergency Department complaining of cough with associated nasal congestion, chills, nausea, myalgias, mild amount of emesis, mild amount of diarrhea, sore throat and HA. He reports that he tried NyQuil last night with no improvement and states that he hasn't taken it again due to it making him feel drowsy upon waking this morning. He denies any chronic medical conditions.    Past Medical History  Diagnosis Date  . Cirrhosis   . Chronic knee pain   . Chronic back pain    History reviewed. No pertinent past surgical history. No family history on file. History  Substance Use Topics  . Smoking status: Current Some Day Smoker -- 0.50 packs/day for 18 years    Types: Cigarettes  . Smokeless tobacco: Never Used  . Alcohol Use: No    Review of Systems  Constitutional: Positive for chills.  HENT: Positive for sore throat. Negative for trouble swallowing.   Respiratory: Positive for cough. Negative for shortness of breath.   Gastrointestinal: Positive for nausea, vomiting and diarrhea.  Musculoskeletal: Positive for myalgias.  Neurological: Positive for dizziness and headaches.  All other systems reviewed and are  negative.     Allergies  Acetaminophen; Darvocet; Ketorolac tromethamine; and Tramadol  Home Medications   Current Outpatient Rx  Name  Route  Sig  Dispense  Refill  . DM-Doxylamine-Acetaminophen 15-6.25-325 MG/15ML LIQD   Oral   Take 5-10 mLs by mouth once as needed (for symptoms).          Triage Vitals: BP 142/89  Pulse 90  Temp(Src) 99.6 F (37.6 C) (Oral)  Resp 20  Ht 5\' 9"  (1.753 m)  Wt 168 lb (76.204 kg)  BMI 24.80 kg/m2  SpO2 100%  Physical Exam  Nursing note and vitals reviewed.  CONSTITUTIONAL: Well developed/well nourished HEAD: Normocephalic/atraumatic EYES: EOMI/PERRL ENMT: Mucous membranes moist, nasal congestion present NECK: supple no meningeal signs SPINE:entire spine nontender CV: S1/S2 noted, no murmurs/rubs/gallops noted LUNGS: Lungs are clear to auscultation bilaterally, no apparent distress ABDOMEN: soft, nontender, no rebound or guarding GU:no cva tenderness NEURO: Pt is awake/alert, moves all extremitiesx4 EXTREMITIES: pulses normal, full ROM SKIN: warm, color normal PSYCH: no abnormalities of mood noted   ED Course  Procedures   DIAGNOSTIC STUDIES: Oxygen Saturation is 100% on RA, normal by my interpretation.    COORDINATION OF CARE: 10:00 PM-Advised pt that lungs are clear. Need for CXR is not indicated. Discussed discharge plan which includes inhaler with pt at bedside and pt agreed to plan. Pt requested antibiotics which I declined based on pt's story and PE. Will provide a work note.    MDM   Final diagnoses:  Cough    Nursing  notes including past medical history and social history reviewed and considered in documentation   I personally performed the services described in this documentation, which was scribed in my presence. The recorded information has been reviewed and is accurate.      Joya Gaskinsonald W Eliani Leclere, MD 09/27/13 2219

## 2014-02-25 ENCOUNTER — Emergency Department (HOSPITAL_COMMUNITY)
Admission: EM | Admit: 2014-02-25 | Discharge: 2014-02-25 | Disposition: A | Discharge status: Home / Self Care | Payer: BC Managed Care – PPO | Attending: Emergency Medicine | Admitting: Emergency Medicine

## 2014-02-25 ENCOUNTER — Encounter (HOSPITAL_COMMUNITY): Payer: Self-pay | Admitting: Emergency Medicine

## 2014-02-25 DIAGNOSIS — F411 Generalized anxiety disorder: Secondary | ICD-10-CM | POA: Insufficient documentation

## 2014-02-25 DIAGNOSIS — G8929 Other chronic pain: Secondary | ICD-10-CM | POA: Diagnosis not present

## 2014-02-25 DIAGNOSIS — R109 Unspecified abdominal pain: Secondary | ICD-10-CM | POA: Insufficient documentation

## 2014-02-25 DIAGNOSIS — IMO0002 Reserved for concepts with insufficient information to code with codable children: Secondary | ICD-10-CM | POA: Diagnosis not present

## 2014-02-25 DIAGNOSIS — F419 Anxiety disorder, unspecified: Secondary | ICD-10-CM

## 2014-02-25 DIAGNOSIS — F17200 Nicotine dependence, unspecified, uncomplicated: Secondary | ICD-10-CM | POA: Diagnosis not present

## 2014-02-25 DIAGNOSIS — Z8719 Personal history of other diseases of the digestive system: Secondary | ICD-10-CM | POA: Diagnosis not present

## 2014-02-25 LAB — CBC WITH DIFFERENTIAL/PLATELET
BASOS PCT: 0 % (ref 0–1)
Basophils Absolute: 0 10*3/uL (ref 0.0–0.1)
EOS ABS: 0.1 10*3/uL (ref 0.0–0.7)
EOS PCT: 1 % (ref 0–5)
HCT: 42.8 % (ref 39.0–52.0)
Hemoglobin: 14.8 g/dL (ref 13.0–17.0)
LYMPHS ABS: 2.1 10*3/uL (ref 0.7–4.0)
Lymphocytes Relative: 28 % (ref 12–46)
MCH: 30.6 pg (ref 26.0–34.0)
MCHC: 34.6 g/dL (ref 30.0–36.0)
MCV: 88.6 fL (ref 78.0–100.0)
Monocytes Absolute: 0.6 10*3/uL (ref 0.1–1.0)
Monocytes Relative: 7 % (ref 3–12)
NEUTROS PCT: 64 % (ref 43–77)
Neutro Abs: 4.9 10*3/uL (ref 1.7–7.7)
PLATELETS: 217 10*3/uL (ref 150–400)
RBC: 4.83 MIL/uL (ref 4.22–5.81)
RDW: 12.4 % (ref 11.5–15.5)
WBC: 7.7 10*3/uL (ref 4.0–10.5)

## 2014-02-25 LAB — BASIC METABOLIC PANEL
Anion gap: 13 (ref 5–15)
BUN: 12 mg/dL (ref 6–23)
CALCIUM: 9.3 mg/dL (ref 8.4–10.5)
CO2: 25 mEq/L (ref 19–32)
Chloride: 102 mEq/L (ref 96–112)
Creatinine, Ser: 1.18 mg/dL (ref 0.50–1.35)
GFR, EST NON AFRICAN AMERICAN: 79 mL/min — AB (ref >90)
GLUCOSE: 118 mg/dL — AB (ref 70–99)
Potassium: 3.8 mEq/L (ref 3.7–5.3)
SODIUM: 140 meq/L (ref 137–147)

## 2014-02-25 MED ORDER — PAROXETINE HCL 20 MG PO TABS: 20.0000 mg | ORAL_TABLET | ORAL | Status: DC

## 2014-02-25 MED ORDER — ALPRAZOLAM 0.5 MG PO TABS: ORAL_TABLET | ORAL | Status: DC

## 2014-02-25 MED ORDER — ALPRAZOLAM 0.5 MG PO TABS
0.5000 mg | ORAL_TABLET | Freq: Once | ORAL | Status: AC
  Administered 2014-02-25: 0.5 mg via ORAL
  Filled 2014-02-25: qty 1

## 2014-02-25 NOTE — ED Notes (Signed)
Pt states he has been having frequent anxiety and panic attacks over last few months, has called Dr. Sharyon Medicus office concerning it and was told they would call back per pt.; denies anxiety at present; denies SI or HI

## 2014-02-25 NOTE — ED Notes (Signed)
Patient verbalizes understanding of discharge instructions, prescription medications, and follow up care. Patient ambulatory out of department at this time. 

## 2014-02-25 NOTE — ED Notes (Signed)
Patient states he has been having conflict with girlfriend over the past few days, and differences with co-workers. Patient states he has been getting upset more frequently and having panic attacks where he gets short of breath, sweats, and shaking. Patient states a friend gave him a Xanax to help him calm down, patient reports that the medication helped his anxiety. Patient states he has contacted his PCP for an appointment, but does not have an appointment at this time. Patient is A&OX4 calm and cooperative.

## 2014-02-25 NOTE — ED Provider Notes (Signed)
CSN: [id number]     Arrival date & time 02/25/14  2008 History   First MD Initiated Contact with Patient 02/25/14 2015     Chief Complaint  Patient presents with  . Anxiety     (Consider location/radiation/quality/duration/timing/severity/associated sxs/prior Treatment) Patient is a 33 y.o. male presenting with anxiety. The history is provided by the patient (pt states he has been having panic attacks in the last 2 months).  Anxiety This is a new problem. The current episode started more than 2 days ago. The problem occurs every several days. The problem has not changed since onset.Associated symptoms include abdominal pain. Pertinent negatives include no chest pain and no headaches. Nothing aggravates the symptoms. Nothing relieves the symptoms.    Past Medical History  Diagnosis Date  . Cirrhosis   . Chronic knee pain   . Chronic back pain    History reviewed. No pertinent past surgical history. History reviewed. No pertinent family history. History  Substance Use Topics  . Smoking status: Current Some Day Smoker -- 0.50 packs/day for 18 years    Types: Cigarettes  . Smokeless tobacco: Never Used  . Alcohol Use: No    Review of Systems  Constitutional: Negative for appetite change and fatigue.  HENT: Negative for congestion, ear discharge and sinus pressure.   Eyes: Negative for discharge.  Respiratory: Negative for cough.   Cardiovascular: Negative for chest pain.  Gastrointestinal: Positive for abdominal pain. Negative for diarrhea.  Genitourinary: Negative for frequency and hematuria.  Musculoskeletal: Negative for back pain.  Skin: Negative for rash.  Neurological: Negative for seizures and headaches.  Psychiatric/Behavioral: Positive for agitation. Negative for hallucinations.      Allergies  Acetaminophen; Darvocet; Ketorolac tromethamine; and Tramadol  Home Medications   Prior to Admission medications   Medication Sig Start Date End Date Taking?  Authorizing Provider  ALPRAZolam Prudy Feeler) 0.5 MG tablet Take one every 8 hours as needed for anxiety 02/25/14   Benny Lennert, MD  PARoxetine (PAXIL) 20 MG tablet Take 1 tablet (20 mg total) by mouth every morning. 02/25/14   Benny Lennert, MD   BP 132/87  Pulse 88  Temp(Src) 98.3 F (36.8 C) (Oral)  Resp 18  Ht  (1.753 m)  Wt 160 lb (72.576 kg)  BMI 23.62 kg/m2  SpO2 99% Physical Exam  Constitutional: He is oriented to person, place, and time. He appears well-developed.  HENT:  Head: Normocephalic.  Eyes: Conjunctivae and EOM are normal. No scleral icterus.  Neck: Neck supple. No thyromegaly present.  Cardiovascular: Normal rate and regular rhythm.  Exam reveals no gallop and no friction rub.   No murmur heard. Pulmonary/Chest: No stridor. He has no wheezes. He has no rales. He exhibits no tenderness.  Abdominal: He exhibits no distension. There is no tenderness. There is no rebound.  Musculoskeletal: Normal range of motion. He exhibits no edema.  Lymphadenopathy:    He has no cervical adenopathy.  Neurological: He is oriented to person, place, and time. He exhibits normal muscle tone. Coordination normal.  Skin: No rash noted. No erythema.  Psychiatric:  Pt is anxious not suicidal or homicidal    ED Course  Procedures (including critical care time) Labs Review Labs Reviewed  BASIC METABOLIC PANEL - Abnormal; Notable for the following:    Glucose, Bld 118 (*)    GFR calc non Af Amer 79 (*)    All other components within normal limits  CBC WITH DIFFERENTIAL    Imaging Review No  results found.   EKG Interpretation None      MDM   Final diagnoses:  Anxiety        Benny Lennert, MD 02/25/14 2134

## 2014-02-25 NOTE — Discharge Instructions (Signed)
Follow up with your md in 1-2 weeks. °

## 2014-03-18 ENCOUNTER — Emergency Department (HOSPITAL_COMMUNITY)
Admission: EM | Admit: 2014-03-18 | Discharge: 2014-03-18 | Disposition: A | Discharge status: Home / Self Care | Payer: BC Managed Care – PPO | Attending: Emergency Medicine | Admitting: Emergency Medicine

## 2014-03-18 ENCOUNTER — Encounter (HOSPITAL_COMMUNITY): Payer: Self-pay | Admitting: Emergency Medicine

## 2014-03-18 DIAGNOSIS — Z8719 Personal history of other diseases of the digestive system: Secondary | ICD-10-CM | POA: Diagnosis not present

## 2014-03-18 DIAGNOSIS — IMO0002 Reserved for concepts with insufficient information to code with codable children: Secondary | ICD-10-CM | POA: Diagnosis present

## 2014-03-18 DIAGNOSIS — G8929 Other chronic pain: Secondary | ICD-10-CM | POA: Insufficient documentation

## 2014-03-18 DIAGNOSIS — X503XXA Overexertion from repetitive movements, initial encounter: Secondary | ICD-10-CM | POA: Diagnosis not present

## 2014-03-18 DIAGNOSIS — F17200 Nicotine dependence, unspecified, uncomplicated: Secondary | ICD-10-CM | POA: Diagnosis not present

## 2014-03-18 DIAGNOSIS — S335XXA Sprain of ligaments of lumbar spine, initial encounter: Secondary | ICD-10-CM | POA: Diagnosis not present

## 2014-03-18 DIAGNOSIS — Z79899 Other long term (current) drug therapy: Secondary | ICD-10-CM | POA: Diagnosis not present

## 2014-03-18 DIAGNOSIS — Y9229 Other specified public building as the place of occurrence of the external cause: Secondary | ICD-10-CM | POA: Insufficient documentation

## 2014-03-18 DIAGNOSIS — Y9389 Activity, other specified: Secondary | ICD-10-CM | POA: Diagnosis not present

## 2014-03-18 DIAGNOSIS — X500XXA Overexertion from strenuous movement or load, initial encounter: Secondary | ICD-10-CM | POA: Diagnosis not present

## 2014-03-18 MED ORDER — HYDROCODONE-ACETAMINOPHEN 5-325 MG PO TABS: ORAL_TABLET | ORAL | Status: DC

## 2014-03-18 MED ORDER — HYDROCODONE-ACETAMINOPHEN 5-325 MG PO TABS
1.0000 | ORAL_TABLET | Freq: Once | ORAL | Status: AC
  Administered 2014-03-18: 1 via ORAL
  Filled 2014-03-18: qty 1

## 2014-03-18 MED ORDER — CYCLOBENZAPRINE HCL 10 MG PO TABS: 10.0000 mg | ORAL_TABLET | Freq: Three times a day (TID) | ORAL | Status: DC | PRN

## 2014-03-18 MED ORDER — CYCLOBENZAPRINE HCL 10 MG PO TABS
10.0000 mg | ORAL_TABLET | Freq: Once | ORAL | Status: AC
  Administered 2014-03-18: 10 mg via ORAL
  Filled 2014-03-18: qty 1

## 2014-03-18 NOTE — ED Notes (Signed)
Pt c/o lower back pain after trying to move an entertainment center

## 2014-03-18 NOTE — Discharge Instructions (Signed)
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
## 2014-03-18 NOTE — ED Provider Notes (Signed)
Medical screening examination/treatment/procedure(s) were performed by non-physician practitioner and as supervising physician I was immediately available for consultation/collaboration.   EKG Interpretation None        Michiko Lineman, MD 03/18/14 2345 

## 2014-03-18 NOTE — ED Provider Notes (Signed)
CSN: [id number]     Arrival date & time 03/18/14  2039 History   First MD Initiated Contact with Patient 03/18/14 2202     Chief Complaint  Patient presents with  . Back Pain     (Consider location/radiation/quality/duration/timing/severity/associated sxs/prior Treatment) HPI  Roy [name] is a 33 y.o. male who presents to the Emergency Department complaining of pain to left lower back after trying to move an entertainment center earlier today.  He states that he felt a "sharp pain" to his left back as soon as he picked the object up and the pain has been persistent all afternoon.  He states he has tried tylenol and ibuprofen w/o relief.  He denies numbness, weakness or pain to his lower extremities., dysuria, abdominal pain, or incontinence of bladder or bowel.  Nothing makes the pain better and symptoms are worse with certain movement.s     Past Medical History  Diagnosis Date  . Cirrhosis   . Chronic knee pain   . Chronic back pain    History reviewed. No pertinent past surgical history. History reviewed. No pertinent family history. History  Substance Use Topics  . Smoking status: Current Some Day Smoker -- 0.50 packs/day for 18 years    Types: Cigarettes  . Smokeless tobacco: Never Used  . Alcohol Use: No    Review of Systems  Constitutional: Negative for fever.  Respiratory: Negative for shortness of breath.   Gastrointestinal: Negative for vomiting, abdominal pain and constipation.  Genitourinary: Negative for dysuria, hematuria, flank pain, decreased urine volume and difficulty urinating.  Musculoskeletal: Positive for back pain. Negative for joint swelling.  Skin: Negative for rash.  Neurological: Negative for weakness and numbness.  All other systems reviewed and are negative.     Allergies  Acetaminophen; Darvocet; Ketorolac tromethamine; and Tramadol  Home Medications   Prior to Admission medications   Medication Sig Start Date End Date Taking?  Authorizing Provider  ALPRAZolam Prudy Feeler) 0.5 MG tablet Take one every 8 hours as needed for anxiety 02/25/14   Benny Lennert, MD  buPROPion (WELLBUTRIN XL) 150 MG 24 hr tablet Take 1 tablet by mouth daily. 03/08/14   Historical Provider, MD  PARoxetine (PAXIL) 20 MG tablet Take 1 tablet (20 mg total) by mouth every morning. 02/25/14   Benny Lennert, MD   BP 127/73  Pulse 77  Temp(Src) 98.5 F (36.9 C) (Oral)  Resp 20  Ht  (1.753 m)  Wt 160 lb (72.576 kg)  BMI 23.62 kg/m2  SpO2 99% Physical Exam  Nursing note and vitals reviewed. Constitutional: He is oriented to person, place, and time. He appears well-developed and well-nourished. No distress.  HENT:  Head: Normocephalic and atraumatic.  Neck: Normal range of motion. Neck supple.  Cardiovascular: Normal rate, regular rhythm, normal heart sounds and intact distal pulses.   No murmur heard. Pulmonary/Chest: Effort normal and breath sounds normal. No respiratory distress.  Abdominal: Soft. He exhibits no distension. There is no tenderness.  Musculoskeletal: He exhibits tenderness. He exhibits no edema.       Lumbar back: He exhibits tenderness and pain. He exhibits normal range of motion, no swelling, no deformity, no laceration and normal pulse.  ttp of the left lumbar paraspinal muscles.  No spinal tenderness.  DP pulses are brisk and symmetrical.  Distal sensation intact.  Hip Flexors/Extensors are intact.  Pt has 5/5 strength against resistance of bilateral lower extremities.     Neurological: He is alert and oriented to person,  place, and time. He has normal strength. No sensory deficit. He exhibits normal muscle tone. Coordination and gait normal.  Reflex Scores:      Patellar reflexes are 2+ on the right side and 2+ on the left side.      Achilles reflexes are 2+ on the right side and 2+ on the left side. Skin: Skin is warm and dry. No rash noted.    ED Course  Procedures (including critical care time) Labs Review Labs  Reviewed - No data to display  Imaging Review No results found.   EKG Interpretation None      MDM   Final diagnoses:  Lumbar sprain, initial encounter    Pt reviewed on the [facility] narcotics database.  No recent narcotics filed.  Patient has ttp of the left lumbar paraspinal muscles c/w musculoskeletal injury.  No concerning symptoms for emergent neurological or infectious process.  Ambulates with a steady gait.  He agrees to symptomatic treatment with flexeril and vicodin.  VSS.  Pt appears stable for d/c   Kaelen Caughlin L. Trisha Mangle, PA-C 03/18/14 2246

## 2014-04-01 ENCOUNTER — Emergency Department (HOSPITAL_COMMUNITY): Payer: Self-pay

## 2014-04-01 ENCOUNTER — Emergency Department (HOSPITAL_COMMUNITY): DRG: 200 | Payer: Self-pay | Attending: General Surgery

## 2014-04-01 ENCOUNTER — Inpatient Hospital Stay (HOSPITAL_COMMUNITY)
Admission: EM | Admit: 2014-04-01 | Discharge: 2014-04-05 | Disposition: A | Discharge status: Home / Self Care | Payer: Self-pay | Source: Home / Self Care

## 2014-04-01 ENCOUNTER — Encounter (HOSPITAL_COMMUNITY): Payer: Self-pay | Admitting: Emergency Medicine

## 2014-04-01 DIAGNOSIS — Z889 Allergy status to unspecified drugs, medicaments and biological substances status: Secondary | ICD-10-CM

## 2014-04-01 DIAGNOSIS — Z885 Allergy status to narcotic agent status: Secondary | ICD-10-CM

## 2014-04-01 DIAGNOSIS — S21219A Laceration without foreign body of unspecified back wall of thorax without penetration into thoracic cavity, initial encounter: Secondary | ICD-10-CM | POA: Diagnosis present

## 2014-04-01 DIAGNOSIS — Z880 Allergy status to penicillin: Secondary | ICD-10-CM

## 2014-04-01 DIAGNOSIS — S21211A Laceration without foreign body of right back wall of thorax without penetration into thoracic cavity, initial encounter: Secondary | ICD-10-CM | POA: Diagnosis present

## 2014-04-01 DIAGNOSIS — S0101XA Laceration without foreign body of scalp, initial encounter: Secondary | ICD-10-CM

## 2014-04-01 DIAGNOSIS — S0191XA Laceration without foreign body of unspecified part of head, initial encounter: Secondary | ICD-10-CM

## 2014-04-01 DIAGNOSIS — D62 Acute posthemorrhagic anemia: Secondary | ICD-10-CM | POA: Diagnosis not present

## 2014-04-01 DIAGNOSIS — S1191XA Laceration without foreign body of unspecified part of neck, initial encounter: Secondary | ICD-10-CM

## 2014-04-01 DIAGNOSIS — S272XXA Traumatic hemopneumothorax, initial encounter: Secondary | ICD-10-CM | POA: Diagnosis present

## 2014-04-01 HISTORY — PX: CHEST TUBE INSERTION: SHX231

## 2014-04-01 LAB — ETHANOL: Alcohol, Ethyl (B): <11 mg/dL (ref 0–11)

## 2014-04-01 LAB — TYPE AND SCREEN
ABO/RH(D): O NEG
ANTIBODY SCREEN: NEGATIVE
UNIT DIVISION: 0
UNIT DIVISION: 0

## 2014-04-01 LAB — PROTIME-INR
INR: 0.98 (ref 0.00–1.49)
PROTHROMBIN TIME: 13 s (ref 11.6–15.2)

## 2014-04-01 LAB — CBC
HCT: 46.4 % (ref 39.0–52.0)
Hemoglobin: 15.7 g/dL (ref 13.0–17.0)
MCH: 30 pg (ref 26.0–34.0)
MCHC: 33.8 g/dL (ref 30.0–36.0)
MCV: 88.5 fL (ref 78.0–100.0)
PLATELETS: 220 10*3/uL (ref 150–400)
RBC: 5.24 MIL/uL (ref 4.22–5.81)
RDW: 13.3 % (ref 11.5–15.5)
WBC: 6.3 10*3/uL (ref 4.0–10.5)

## 2014-04-01 LAB — PREPARE FRESH FROZEN PLASMA
UNIT DIVISION: 0
Unit division: 0

## 2014-04-01 LAB — COMPREHENSIVE METABOLIC PANEL
ALT: 14 U/L (ref 0–53)
ANION GAP: 14 (ref 5–15)
AST: 19 U/L (ref 0–37)
Albumin: 4 g/dL (ref 3.5–5.2)
Alkaline Phosphatase: 43 U/L (ref 39–117)
BILIRUBIN TOTAL: 0.4 mg/dL (ref 0.3–1.2)
BUN: 12 mg/dL (ref 6–23)
CO2: 26 meq/L (ref 19–32)
CREATININE: 1.02 mg/dL (ref 0.50–1.35)
Calcium: 9.9 mg/dL (ref 8.4–10.5)
Chloride: 99 mEq/L (ref 96–112)
GFR calc non Af Amer: >90 mL/min (ref >90)
GLUCOSE: 112 mg/dL — AB (ref 70–99)
Potassium: 4.3 mEq/L (ref 3.7–5.3)
Sodium: 139 mEq/L (ref 137–147)
Total Protein: 8.5 g/dL — ABNORMAL HIGH (ref 6.0–8.3)

## 2014-04-01 LAB — ABO/RH: ABO/RH(D): O NEG

## 2014-04-01 LAB — CDS SEROLOGY

## 2014-04-01 LAB — GLUCOSE, CAPILLARY: Glucose-Capillary: 109 mg/dL — ABNORMAL HIGH (ref 70–99)

## 2014-04-01 MED ORDER — PANTOPRAZOLE SODIUM 40 MG PO TBEC: 40.0000 mg | DELAYED_RELEASE_TABLET | Freq: Every day | ORAL | Status: DC

## 2014-04-01 MED ORDER — MIDAZOLAM HCL 5 MG/5ML IJ SOLN
INTRAMUSCULAR | Status: AC | PRN
  Administered 2014-04-01: 2 mg via INTRAVENOUS

## 2014-04-01 MED ORDER — MIDAZOLAM HCL 2 MG/2ML IJ SOLN
INTRAMUSCULAR | Status: AC
  Filled 2014-04-01: qty 2

## 2014-04-01 MED ORDER — SODIUM CHLORIDE 0.9 % IV SOLN: 250.0000 mL | INTRAVENOUS | Status: DC | PRN

## 2014-04-01 MED ORDER — ONDANSETRON HCL 4 MG/2ML IJ SOLN: 4.0000 mg | Freq: Four times a day (QID) | INTRAMUSCULAR | Status: DC | PRN

## 2014-04-01 MED ORDER — SODIUM CHLORIDE 0.9 % IJ SOLN
3.0000 mL | Freq: Two times a day (BID) | INTRAMUSCULAR | Status: DC
  Administered 2014-04-01 – 2014-04-05 (×8): 3 mL via INTRAVENOUS

## 2014-04-01 MED ORDER — ALPRAZOLAM 0.5 MG PO TABS
0.5000 mg | ORAL_TABLET | Freq: Three times a day (TID) | ORAL | Status: DC | PRN
  Administered 2014-04-01 – 2014-04-05 (×11): 0.5 mg via ORAL
  Filled 2014-04-01 (×11): qty 1

## 2014-04-01 MED ORDER — LIDOCAINE HCL (PF) 1 % IJ SOLN
INTRAMUSCULAR | Status: AC
  Filled 2014-04-01: qty 5

## 2014-04-01 MED ORDER — FENTANYL CITRATE 0.05 MG/ML IJ SOLN
INTRAMUSCULAR | Status: AC
  Administered 2014-04-01: 100 ug via INTRAVENOUS
  Filled 2014-04-01: qty 2

## 2014-04-01 MED ORDER — POLYETHYLENE GLYCOL 3350 17 G PO PACK
17.0000 g | PACK | Freq: Every day | ORAL | Status: DC
  Administered 2014-04-02 – 2014-04-04 (×2): 17 g via ORAL
  Filled 2014-04-01 (×5): qty 1

## 2014-04-01 MED ORDER — HYDROMORPHONE HCL 1 MG/ML IJ SOLN
0.5000 mg | INTRAMUSCULAR | Status: DC | PRN
  Administered 2014-04-01 – 2014-04-05 (×16): 0.5 mg via INTRAVENOUS
  Filled 2014-04-01 (×16): qty 1

## 2014-04-01 MED ORDER — FENTANYL CITRATE 0.05 MG/ML IJ SOLN
INTRAMUSCULAR | Status: AC | PRN
  Administered 2014-04-01 (×2): 100 ug via INTRAVENOUS

## 2014-04-01 MED ORDER — ENOXAPARIN SODIUM 40 MG/0.4ML ~~LOC~~ SOLN
40.0000 mg | SUBCUTANEOUS | Status: DC
  Administered 2014-04-01 – 2014-04-02 (×2): 40 mg via SUBCUTANEOUS
  Filled 2014-04-01 (×4): qty 0.4

## 2014-04-01 MED ORDER — FENTANYL CITRATE 0.05 MG/ML IJ SOLN
INTRAMUSCULAR | Status: AC
  Filled 2014-04-01: qty 2

## 2014-04-01 MED ORDER — SODIUM CHLORIDE 0.9 % IJ SOLN: 3.0000 mL | INTRAMUSCULAR | Status: DC | PRN

## 2014-04-01 MED ORDER — PANTOPRAZOLE SODIUM 40 MG IV SOLR
40.0000 mg | Freq: Every day | INTRAVENOUS | Status: DC
  Filled 2014-04-01 (×2): qty 40

## 2014-04-01 MED ORDER — DOCUSATE SODIUM 100 MG PO CAPS
100.0000 mg | ORAL_CAPSULE | Freq: Two times a day (BID) | ORAL | Status: DC
  Administered 2014-04-01 – 2014-04-04 (×7): 100 mg via ORAL
  Filled 2014-04-01 (×8): qty 1

## 2014-04-01 MED ORDER — ONDANSETRON HCL 4 MG PO TABS: 4.0000 mg | ORAL_TABLET | Freq: Four times a day (QID) | ORAL | Status: DC | PRN

## 2014-04-01 MED ORDER — NICOTINE 21 MG/24HR TD PT24
21.0000 mg | MEDICATED_PATCH | Freq: Once | TRANSDERMAL | Status: AC
  Administered 2014-04-01: 21 mg via TRANSDERMAL
  Filled 2014-04-01: qty 1

## 2014-04-01 MED ORDER — OXYCODONE HCL 5 MG PO TABS
5.0000 mg | ORAL_TABLET | ORAL | Status: DC | PRN
  Administered 2014-04-01 – 2014-04-02 (×4): 15 mg via ORAL
  Administered 2014-04-02: 10 mg via ORAL
  Filled 2014-04-01: qty 2
  Filled 2014-04-01 (×4): qty 3

## 2014-04-01 MED ORDER — BACITRACIN ZINC 500 UNIT/GM EX OINT
TOPICAL_OINTMENT | Freq: Two times a day (BID) | CUTANEOUS | Status: DC
  Administered 2014-04-01 – 2014-04-05 (×8): via TOPICAL
  Filled 2014-04-01: qty 28.35
  Filled 2014-04-01: qty 15

## 2014-04-01 NOTE — Progress Notes (Signed)
UR completed.  Kyarra Vancamp, RN BSN [facility] CCM Trauma/Neuro ICU Case Manager [phone]  

## 2014-04-01 NOTE — Progress Notes (Signed)
04/01/14 1100  Clinical Encounter Type  Visited With Patient;Health care provider;Family  Visit Type Initial;ED;Trauma   Chaplain was paged at :11:04AM and notified that the patient's mother had arrived. Chaplain accompanied patient's mother to a brief consultation with the patient's physician. Patient's mother is currently at bedside. Chaplain will continue to provide emotional and spiritual support for patient and patient's family as needed. Cranston NeighborStrother, Katrinna Travieso R, Chaplain 11:29 AM
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
## 2014-04-01 NOTE — ED Notes (Signed)
Family at bedside. 

## 2014-04-01 NOTE — ED Notes (Signed)
28 fr chest placed per Janee Mornhompson, MD.  400 ml blood return post placement.

## 2014-04-01 NOTE — ED Notes (Signed)
MD at bedside. 

## 2014-04-01 NOTE — ED Notes (Signed)
Patient to CT at this time,   Manson PasseyBrown, RN accompanying

## 2014-04-01 NOTE — ED Notes (Signed)
Returned from ct, placed back on monitor.

## 2014-04-01 NOTE — ED Notes (Signed)
Vital signs stable. 

## 2014-04-01 NOTE — ED Notes (Signed)
Left scalp wound repaired per Tinnie GensJeffrey, PA, 2 staples
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
## 2014-04-01 NOTE — ED Notes (Signed)
Patient is resting comfortably. 

## 2014-04-01 NOTE — Procedures (Signed)
Chest Tube Insertion Procedure Note  Indications:  Clinically significant Pneumothorax and Hemothorax  Pre-operative Diagnosis: R HPTX  Post-operative Diagnosis: R HPTX  Procedure Details  Emergency consent was obtained for the procedure, including sedation.   After sterile skin prep, using standard technique, a 28 French tube was placed in the right anterior axillary line, nipple level.   Findings: 400 ml of blood obtained  Complications:  None; patient tolerated the procedure well.         Disposition: trauma bay         Condition: stable  Attending Attestation: I was present and scrubbed for the entire procedure and assisted the ED resident.
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
## 2014-04-01 NOTE — ED Notes (Signed)
C-collar placed on patient using spinal immobilization

## 2014-04-01 NOTE — H&P (Signed)
Seen together. I also spoke with his mother. See chest tube note as well. Patient examined and I agree with the assessment and plan  Violeta GelinasBurke Hudson Majkowski, MD, MPH, FACS Trauma: [phone] General Surgery: [phone]  04/01/2014 11:58 AM
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
## 2014-04-02 ENCOUNTER — Inpatient Hospital Stay (HOSPITAL_COMMUNITY): Payer: Self-pay

## 2014-04-02 DIAGNOSIS — D62 Acute posthemorrhagic anemia: Secondary | ICD-10-CM | POA: Diagnosis not present

## 2014-04-02 LAB — CBC
HCT: 34.3 % — ABNORMAL LOW (ref 39.0–52.0)
Hemoglobin: 11.4 g/dL — ABNORMAL LOW (ref 13.0–17.0)
MCH: 29.9 pg (ref 26.0–34.0)
MCHC: 33.2 g/dL (ref 30.0–36.0)
MCV: 90 fL (ref 78.0–100.0)
Platelets: 224 10*3/uL (ref 150–400)
RBC: 3.81 MIL/uL — AB (ref 4.22–5.81)
RDW: 13.4 % (ref 11.5–15.5)
WBC: 7.6 10*3/uL (ref 4.0–10.5)

## 2014-04-02 MED ORDER — OXYCODONE HCL 5 MG PO TABS
10.0000 mg | ORAL_TABLET | ORAL | Status: DC | PRN
  Administered 2014-04-02 – 2014-04-05 (×17): 20 mg via ORAL
  Filled 2014-04-02 (×17): qty 4

## 2014-04-02 MED ORDER — NAPROXEN 500 MG PO TABS
500.0000 mg | ORAL_TABLET | Freq: Two times a day (BID) | ORAL | Status: DC
  Administered 2014-04-02 – 2014-04-05 (×7): 500 mg via ORAL
  Filled 2014-04-02: qty 2
  Filled 2014-04-02 (×2): qty 1
  Filled 2014-04-02: qty 2
  Filled 2014-04-02 (×5): qty 1
  Filled 2014-04-02: qty 2
  Filled 2014-04-02 (×3): qty 1
  Filled 2014-04-02 (×2): qty 2

## 2014-04-02 NOTE — Progress Notes (Signed)
[facility] CT. Transfer to floor. Patient examined and I agree with the assessment and plan  Violeta GelinasBurke Kynnadi Dicenso, MD, MPH, FACS Trauma: [phone] General Surgery: [phone]  04/02/2014 9:37 AM

## 2014-04-02 NOTE — Progress Notes (Signed)
Pt. Transferring to [phone] handoff report given to [facility].  Pt. Stable at transfer.
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
## 2014-04-03 ENCOUNTER — Inpatient Hospital Stay (HOSPITAL_COMMUNITY): Payer: Self-pay

## 2014-04-03 ENCOUNTER — Encounter (HOSPITAL_COMMUNITY): Payer: Self-pay | Admitting: General Practice

## 2014-04-03 LAB — CBC
HEMATOCRIT: 32.7 % — AB (ref 39.0–52.0)
Hemoglobin: 10.8 g/dL — ABNORMAL LOW (ref 13.0–17.0)
MCH: 29.1 pg (ref 26.0–34.0)
MCHC: 33 g/dL (ref 30.0–36.0)
MCV: 88.1 fL (ref 78.0–100.0)
Platelets: 192 10*3/uL (ref 150–400)
RBC: 3.71 MIL/uL — ABNORMAL LOW (ref 4.22–5.81)
RDW: 13.3 % (ref 11.5–15.5)
WBC: 5 10*3/uL (ref 4.0–10.5)
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
## 2014-04-04 ENCOUNTER — Inpatient Hospital Stay (HOSPITAL_COMMUNITY): Payer: Self-pay

## 2014-04-04 LAB — CBC
HEMATOCRIT: 32 % — AB (ref 39.0–52.0)
HEMOGLOBIN: 10.5 g/dL — AB (ref 13.0–17.0)
MCH: 29.1 pg (ref 26.0–34.0)
MCHC: 32.8 g/dL (ref 30.0–36.0)
MCV: 88.6 fL (ref 78.0–100.0)
Platelets: 199 10*3/uL (ref 150–400)
RBC: 3.61 MIL/uL — ABNORMAL LOW (ref 4.22–5.81)
RDW: 13 % (ref 11.5–15.5)
WBC: 4.9 10*3/uL (ref 4.0–10.5)

## 2014-04-04 NOTE — Progress Notes (Signed)
Await decrease in CT output. Hematoma stable. Patient examined and I agree with the assessment and plan  Violeta GelinasBurke Lautaro Koral, MD, MPH, FACS Trauma: [phone] General Surgery: [phone]  04/04/2014 10:30 AM

## 2014-04-04 NOTE — Progress Notes (Signed)
UR completed. No HH needs anticipated.  Elazar Argabright, RN BSN MHA CCM Trauma/Neuro ICU Case Manager [phone]  
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
## 2014-04-05 ENCOUNTER — Inpatient Hospital Stay (HOSPITAL_COMMUNITY): Payer: BC Managed Care – PPO

## 2014-04-05 ENCOUNTER — Inpatient Hospital Stay (HOSPITAL_COMMUNITY): Payer: Self-pay

## 2014-04-05 MED ORDER — OXYCODONE-ACETAMINOPHEN 10-325 MG PO TABS: 1.0000 | ORAL_TABLET | ORAL | Status: DC | PRN

## 2014-04-05 MED ORDER — NAPROXEN 500 MG PO TABS: 500.0000 mg | ORAL_TABLET | Freq: Two times a day (BID) | ORAL | Status: DC

## 2014-04-05 NOTE — Discharge Summary (Signed)
Bless Lisenby, MD, MPH, FACS Trauma: [phone] General Surgery: [phone]  
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
## 2014-04-05 NOTE — Progress Notes (Signed)
CXR OK after CT removal. Plan D/C Patient examined and I agree with the assessment and plan  Violeta GelinasBurke Makeba Delcastillo, MD, MPH, FACS Trauma: [phone] General Surgery: [phone]  04/05/2014 9:49 AM
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
## 2014-04-05 NOTE — Discharge Instructions (Signed)
Remove chest dressing on Sunday, then:  Wash wounds daily in shower with soap and water. Do not soak. Apply antibiotic ointment (e.g. Neosporin) twice daily and as needed to keep moist. Cover with dry dressing.  No driving while taking oxycodone.

## 2014-04-08 MED ORDER — SODIUM CHLORIDE 0.9 % IV SOLN
INTRAVENOUS | Status: AC | PRN
  Administered 2014-04-01: 150 mL/h via INTRAVENOUS

## 2014-04-12 ENCOUNTER — Telehealth (HOSPITAL_COMMUNITY): Payer: Self-pay
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
## 2014-04-14 ENCOUNTER — Emergency Department (HOSPITAL_COMMUNITY): Payer: BC Managed Care – PPO

## 2014-04-14 ENCOUNTER — Encounter (HOSPITAL_COMMUNITY): Payer: Self-pay | Admitting: Emergency Medicine

## 2014-04-14 ENCOUNTER — Emergency Department (HOSPITAL_COMMUNITY)
Admission: EM | Admit: 2014-04-14 | Discharge: 2014-04-14 | Disposition: A | Discharge status: Home / Self Care | Payer: BC Managed Care – PPO | Attending: Emergency Medicine | Admitting: Emergency Medicine

## 2014-04-14 DIAGNOSIS — R52 Pain, unspecified: Secondary | ICD-10-CM

## 2014-04-14 DIAGNOSIS — S21211D Laceration without foreign body of right back wall of thorax without penetration into thoracic cavity, subsequent encounter: Secondary | ICD-10-CM

## 2014-04-14 DIAGNOSIS — Z8719 Personal history of other diseases of the digestive system: Secondary | ICD-10-CM | POA: Insufficient documentation

## 2014-04-14 DIAGNOSIS — S21201D Unspecified open wound of right back wall of thorax without penetration into thoracic cavity, subsequent encounter: Secondary | ICD-10-CM | POA: Insufficient documentation

## 2014-04-14 DIAGNOSIS — G8929 Other chronic pain: Secondary | ICD-10-CM | POA: Insufficient documentation

## 2014-04-14 DIAGNOSIS — L03312 Cellulitis of back [any part except buttock]: Secondary | ICD-10-CM | POA: Insufficient documentation

## 2014-04-14 DIAGNOSIS — Y289XXD Contact with unspecified sharp object, undetermined intent, subsequent encounter: Secondary | ICD-10-CM | POA: Insufficient documentation

## 2014-04-14 DIAGNOSIS — L03818 Cellulitis of other sites: Secondary | ICD-10-CM

## 2014-04-14 DIAGNOSIS — Z720 Tobacco use: Secondary | ICD-10-CM | POA: Insufficient documentation

## 2014-04-14 HISTORY — DX: Other injury of unspecified body region, initial encounter: T14.8XXA

## 2014-04-14 MED ORDER — SULFAMETHOXAZOLE-TRIMETHOPRIM 800-160 MG PO TABS: 1.0000 | ORAL_TABLET | Freq: Two times a day (BID) | ORAL | Status: DC

## 2014-04-14 MED ORDER — OXYCODONE HCL 5 MG PO TABS
10.0000 mg | ORAL_TABLET | Freq: Once | ORAL | Status: AC
  Administered 2014-04-14: 10 mg via ORAL
  Filled 2014-04-14: qty 2

## 2014-04-14 MED ORDER — SULFAMETHOXAZOLE-TMP DS 800-160 MG PO TABS
1.0000 | ORAL_TABLET | Freq: Once | ORAL | Status: AC
  Administered 2014-04-14: 1 via ORAL
  Filled 2014-04-14: qty 1

## 2014-04-14 MED ORDER — OXYCODONE HCL 5 MG PO TABS: 5.0000 mg | ORAL_TABLET | ORAL | Status: DC | PRN

## 2014-04-14 NOTE — Discharge Instructions (Signed)
Keep clean.  Moist heat.  If pus comes out of wound, that is good.   Prescription for antibiotic and pain medicine.
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
## 2014-04-22 ENCOUNTER — Emergency Department (HOSPITAL_COMMUNITY): Payer: BC Managed Care – PPO

## 2014-04-22 ENCOUNTER — Emergency Department (HOSPITAL_COMMUNITY): Payer: Self-pay

## 2014-04-22 ENCOUNTER — Encounter (HOSPITAL_COMMUNITY): Payer: Self-pay | Admitting: *Deleted

## 2014-04-22 ENCOUNTER — Emergency Department (HOSPITAL_COMMUNITY)
Admission: EM | Admit: 2014-04-22 | Discharge: 2014-04-22 | Disposition: A | Discharge status: Home / Self Care | Payer: Self-pay | Attending: Emergency Medicine | Admitting: Emergency Medicine

## 2014-04-22 DIAGNOSIS — R079 Chest pain, unspecified: Secondary | ICD-10-CM

## 2014-04-22 DIAGNOSIS — J90 Pleural effusion, not elsewhere classified: Secondary | ICD-10-CM

## 2014-04-22 DIAGNOSIS — Z792 Long term (current) use of antibiotics: Secondary | ICD-10-CM | POA: Insufficient documentation

## 2014-04-22 DIAGNOSIS — Z87828 Personal history of other (healed) physical injury and trauma: Secondary | ICD-10-CM | POA: Insufficient documentation

## 2014-04-22 DIAGNOSIS — G8929 Other chronic pain: Secondary | ICD-10-CM | POA: Insufficient documentation

## 2014-04-22 DIAGNOSIS — Z720 Tobacco use: Secondary | ICD-10-CM | POA: Insufficient documentation

## 2014-04-22 DIAGNOSIS — Z8719 Personal history of other diseases of the digestive system: Secondary | ICD-10-CM | POA: Insufficient documentation

## 2014-04-22 DIAGNOSIS — Z4801 Encounter for change or removal of surgical wound dressing: Secondary | ICD-10-CM | POA: Insufficient documentation

## 2014-04-22 DIAGNOSIS — M549 Dorsalgia, unspecified: Secondary | ICD-10-CM | POA: Insufficient documentation

## 2014-04-22 MED ORDER — OXYCODONE HCL 5 MG PO TABS: 5.0000 mg | ORAL_TABLET | Freq: Four times a day (QID) | ORAL | Status: DC | PRN

## 2014-04-22 MED ORDER — HYDROMORPHONE HCL 2 MG/ML IJ SOLN
2.0000 mg | Freq: Once | INTRAMUSCULAR | Status: AC
  Administered 2014-04-22: 2 mg via INTRAMUSCULAR
  Filled 2014-04-22: qty 1

## 2014-04-22 NOTE — ED Notes (Signed)
MD at bedside. 
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
## 2014-04-22 NOTE — Discharge Instructions (Signed)
Wound care as we discussed. CT scan does confirm the pleural effusion on the right side. This is of moderate size. Would call and make an appointment to follow-up with the surgeons earlier than your signed appointment. If possible. Take pain medication as directed. Return for any new or worse symptoms.

## 2014-04-24 ENCOUNTER — Other Ambulatory Visit (INDEPENDENT_AMBULATORY_CARE_PROVIDER_SITE_OTHER): Payer: Self-pay | Admitting: General Surgery

## 2014-04-24 DIAGNOSIS — J90 Pleural effusion, not elsewhere classified: Secondary | ICD-10-CM

## 2014-04-25 ENCOUNTER — Ambulatory Visit (HOSPITAL_COMMUNITY)
Admission: RE | Admit: 2014-04-25 | Discharge: 2014-04-25 | Disposition: A | Discharge status: Home / Self Care | Payer: Self-pay | Source: Ambulatory Visit | Attending: General Surgery | Admitting: General Surgery

## 2014-04-25 ENCOUNTER — Ambulatory Visit (HOSPITAL_COMMUNITY)
Admission: RE | Admit: 2014-04-25 | Discharge: 2014-04-25 | Disposition: A | Discharge status: Home / Self Care | Payer: Self-pay | Source: Ambulatory Visit | Attending: Radiology | Admitting: Radiology

## 2014-04-25 DIAGNOSIS — J90 Pleural effusion, not elsewhere classified: Secondary | ICD-10-CM | POA: Insufficient documentation

## 2014-04-25 DIAGNOSIS — J9811 Atelectasis: Secondary | ICD-10-CM | POA: Insufficient documentation

## 2014-04-25 MED ORDER — LIDOCAINE HCL (PF) 1 % IJ SOLN
INTRAMUSCULAR | Status: AC
  Filled 2014-04-25: qty 10

## 2014-04-25 NOTE — Procedures (Signed)
  US guided Rt thora 200 cc bloody fluid  cxr pending Not sent for labs per MD

## 2014-05-02 ENCOUNTER — Encounter (HOSPITAL_COMMUNITY): Payer: Self-pay | Admitting: *Deleted

## 2014-05-02 ENCOUNTER — Emergency Department (HOSPITAL_COMMUNITY)
Admission: EM | Admit: 2014-05-02 | Discharge: 2014-05-03 | Discharge status: Against Medical Advice | Payer: Self-pay | Attending: Emergency Medicine | Admitting: Emergency Medicine

## 2014-05-02 ENCOUNTER — Emergency Department (HOSPITAL_COMMUNITY): Payer: Self-pay

## 2014-05-02 DIAGNOSIS — Z8719 Personal history of other diseases of the digestive system: Secondary | ICD-10-CM | POA: Insufficient documentation

## 2014-05-02 DIAGNOSIS — R0789 Other chest pain: Secondary | ICD-10-CM | POA: Insufficient documentation

## 2014-05-02 DIAGNOSIS — Z5320 Procedure and treatment not carried out because of patient's decision for unspecified reasons: Secondary | ICD-10-CM

## 2014-05-02 DIAGNOSIS — Z792 Long term (current) use of antibiotics: Secondary | ICD-10-CM | POA: Insufficient documentation

## 2014-05-02 DIAGNOSIS — R109 Unspecified abdominal pain: Secondary | ICD-10-CM | POA: Insufficient documentation

## 2014-05-02 DIAGNOSIS — Z720 Tobacco use: Secondary | ICD-10-CM | POA: Insufficient documentation

## 2014-05-02 DIAGNOSIS — Z5329 Procedure and treatment not carried out because of patient's decision for other reasons: Secondary | ICD-10-CM

## 2014-05-02 DIAGNOSIS — R079 Chest pain, unspecified: Secondary | ICD-10-CM

## 2014-05-02 DIAGNOSIS — M549 Dorsalgia, unspecified: Secondary | ICD-10-CM | POA: Insufficient documentation

## 2014-05-02 DIAGNOSIS — Z87828 Personal history of other (healed) physical injury and trauma: Secondary | ICD-10-CM | POA: Insufficient documentation

## 2014-05-02 DIAGNOSIS — G8929 Other chronic pain: Secondary | ICD-10-CM | POA: Insufficient documentation
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
## 2014-05-03 MED ORDER — OXYCODONE HCL 5 MG PO TABS: 5.0000 mg | ORAL_TABLET | Freq: Once | ORAL | Status: DC

## 2014-05-03 MED ORDER — PREDNISONE 50 MG PO TABS: 60.0000 mg | ORAL_TABLET | Freq: Once | ORAL | Status: DC
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
## 2014-05-04 ENCOUNTER — Encounter (HOSPITAL_COMMUNITY): Payer: Self-pay | Admitting: Emergency Medicine

## 2014-05-04 ENCOUNTER — Emergency Department (HOSPITAL_COMMUNITY)
Admission: EM | Admit: 2014-05-04 | Discharge: 2014-05-04 | Disposition: A | Discharge status: Home / Self Care | Payer: Self-pay | Attending: Emergency Medicine | Admitting: Emergency Medicine

## 2014-05-04 DIAGNOSIS — G8911 Acute pain due to trauma: Secondary | ICD-10-CM | POA: Insufficient documentation

## 2014-05-04 DIAGNOSIS — Z5189 Encounter for other specified aftercare: Secondary | ICD-10-CM

## 2014-05-04 DIAGNOSIS — M546 Pain in thoracic spine: Secondary | ICD-10-CM | POA: Insufficient documentation

## 2014-05-04 DIAGNOSIS — R109 Unspecified abdominal pain: Secondary | ICD-10-CM | POA: Insufficient documentation

## 2014-05-04 DIAGNOSIS — Z4800 Encounter for change or removal of nonsurgical wound dressing: Secondary | ICD-10-CM | POA: Insufficient documentation

## 2014-05-04 DIAGNOSIS — Z8719 Personal history of other diseases of the digestive system: Secondary | ICD-10-CM | POA: Insufficient documentation

## 2014-05-04 DIAGNOSIS — G8929 Other chronic pain: Secondary | ICD-10-CM | POA: Insufficient documentation

## 2014-05-04 DIAGNOSIS — M542 Cervicalgia: Secondary | ICD-10-CM | POA: Insufficient documentation

## 2014-05-04 DIAGNOSIS — Z720 Tobacco use: Secondary | ICD-10-CM | POA: Insufficient documentation

## 2014-05-04 DIAGNOSIS — Z792 Long term (current) use of antibiotics: Secondary | ICD-10-CM | POA: Insufficient documentation

## 2014-05-04 MED ORDER — OXYCODONE HCL 5 MG PO CAPS: 5.0000 mg | ORAL_CAPSULE | Freq: Four times a day (QID) | ORAL | Status: DC | PRN
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
## 2014-05-16 ENCOUNTER — Emergency Department (HOSPITAL_COMMUNITY): Payer: Self-pay

## 2014-05-16 ENCOUNTER — Emergency Department (HOSPITAL_COMMUNITY)
Admission: EM | Admit: 2014-05-16 | Discharge: 2014-05-17 | Disposition: A | Discharge status: Home / Self Care | Payer: Self-pay | Attending: Emergency Medicine | Admitting: Emergency Medicine

## 2014-05-16 ENCOUNTER — Encounter (HOSPITAL_COMMUNITY): Payer: Self-pay | Admitting: *Deleted

## 2014-05-16 ENCOUNTER — Encounter (HOSPITAL_COMMUNITY): Payer: Self-pay | Admitting: Emergency Medicine

## 2014-05-16 ENCOUNTER — Emergency Department (HOSPITAL_COMMUNITY)
Admission: EM | Admit: 2014-05-16 | Discharge: 2014-05-16 | Discharge status: Against Medical Advice | Payer: Self-pay | Attending: Emergency Medicine | Admitting: Emergency Medicine

## 2014-05-16 DIAGNOSIS — X58XXXA Exposure to other specified factors, initial encounter: Secondary | ICD-10-CM | POA: Insufficient documentation

## 2014-05-16 DIAGNOSIS — Y9389 Activity, other specified: Secondary | ICD-10-CM | POA: Insufficient documentation

## 2014-05-16 DIAGNOSIS — S8991XA Unspecified injury of right lower leg, initial encounter: Secondary | ICD-10-CM | POA: Insufficient documentation

## 2014-05-16 DIAGNOSIS — M5489 Other dorsalgia: Secondary | ICD-10-CM

## 2014-05-16 DIAGNOSIS — G8929 Other chronic pain: Secondary | ICD-10-CM | POA: Insufficient documentation

## 2014-05-16 DIAGNOSIS — T1490XA Injury, unspecified, initial encounter: Secondary | ICD-10-CM

## 2014-05-16 DIAGNOSIS — M545 Low back pain: Secondary | ICD-10-CM | POA: Insufficient documentation

## 2014-05-16 DIAGNOSIS — Y9289 Other specified places as the place of occurrence of the external cause: Secondary | ICD-10-CM | POA: Insufficient documentation

## 2014-05-16 DIAGNOSIS — Z8781 Personal history of (healed) traumatic fracture: Secondary | ICD-10-CM | POA: Insufficient documentation

## 2014-05-16 DIAGNOSIS — Z720 Tobacco use: Secondary | ICD-10-CM | POA: Insufficient documentation

## 2014-05-16 DIAGNOSIS — Y998 Other external cause status: Secondary | ICD-10-CM | POA: Insufficient documentation

## 2014-05-16 DIAGNOSIS — S39012A Strain of muscle, fascia and tendon of lower back, initial encounter: Secondary | ICD-10-CM | POA: Insufficient documentation

## 2014-05-16 DIAGNOSIS — Z792 Long term (current) use of antibiotics: Secondary | ICD-10-CM | POA: Insufficient documentation

## 2014-05-16 DIAGNOSIS — Y929 Unspecified place or not applicable: Secondary | ICD-10-CM | POA: Insufficient documentation

## 2014-05-16 HISTORY — DX: Other chronic pain: G89.29

## 2014-05-16 HISTORY — DX: Other chest pain: R07.89

## 2014-05-16 MED ORDER — TRAMADOL HCL 50 MG PO TABS
50.0000 mg | ORAL_TABLET | Freq: Once | ORAL | Status: DC
  Filled 2014-05-16: qty 1

## 2014-05-16 MED ORDER — ONDANSETRON 4 MG PO TBDP
4.0000 mg | ORAL_TABLET | Freq: Once | ORAL | Status: DC
  Filled 2014-05-16: qty 1

## 2014-05-16 NOTE — ED Notes (Signed)
Low back pain, and pain rt knee, Pt was here earlier and left
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
## 2014-05-16 NOTE — ED Notes (Signed)
Pt. States he needs to leave for family emergency. Pt. States he will return later to be evaluated. Pt. Verbalized understanding of risks of leaving.
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
## 2014-05-16 NOTE — ED Notes (Signed)
Patient refused to take zofran and tramadol. Patient stated, "I cannot take tramadol because it makes me sick." Offered to give patient zofran for nausea and patient refused both medications. Patient stated, "I thought the doctor said he was going to give me a shot."

## 2014-05-17 MED ORDER — NAPROXEN 500 MG PO TABS: 500.0000 mg | ORAL_TABLET | Freq: Two times a day (BID) | ORAL | Status: DC

## 2014-05-17 MED ORDER — ONDANSETRON HCL 4 MG PO TABS: 4.0000 mg | ORAL_TABLET | Freq: Three times a day (TID) | ORAL | Status: DC | PRN

## 2014-05-17 NOTE — Discharge Instructions (Signed)
Your testing shows no signs of fracture of your knee - read the instructions attached, if you don't have a physician, see the list below.  [facility]    Kari BaarsEdward Hawkins MD. Specialty: Pulmonary Disease Contact information: [address]  [address]  [place] [address]  [phone]   Syliva OvermanMargaret Simpson, MD. Specialty: [facility] Contact information: [address]  [place] [address]  [phone]   Lilyan PuntScott Luking, MD. Specialty: [facility] Contact information: [address]  [address]  [place] [address]  [phone]   Avon Gullyesfaye Fanta, MD Specialty: Internal Medicine Contact information: [address]  [place] [address]  [phone]   Catalina PizzaZach Hall, MD. Specialty: Internal Medicine Contact information: [address]  [place] [address]  [phone]   Butch PennyAngus Mcinnis, MD. Specialty: Family Medicine Contact information: [address]  [place] [address]  [phone]   John GiovanniStephen Knowlton, MD. Specialty: [facility] Contact information: [address]  [address]  [place] [address]  [phone]   Carylon Perchesoy Fagan, MD. Specialty: Internal Medicine Contact information: [address]  [address]  [place] [address]  [phone]

## 2014-05-19 ENCOUNTER — Emergency Department (HOSPITAL_COMMUNITY)
Admission: EM | Admit: 2014-05-19 | Discharge: 2014-05-19 | Disposition: A | Discharge status: Home / Self Care | Payer: Self-pay | Attending: Emergency Medicine | Admitting: Emergency Medicine

## 2014-05-19 ENCOUNTER — Encounter (HOSPITAL_COMMUNITY): Payer: Self-pay | Admitting: Emergency Medicine

## 2014-05-19 DIAGNOSIS — M25561 Pain in right knee: Secondary | ICD-10-CM | POA: Insufficient documentation

## 2014-05-19 DIAGNOSIS — M549 Dorsalgia, unspecified: Secondary | ICD-10-CM | POA: Insufficient documentation

## 2014-05-19 DIAGNOSIS — Z79899 Other long term (current) drug therapy: Secondary | ICD-10-CM | POA: Insufficient documentation

## 2014-05-19 DIAGNOSIS — Z720 Tobacco use: Secondary | ICD-10-CM | POA: Insufficient documentation

## 2014-05-19 DIAGNOSIS — G8929 Other chronic pain: Secondary | ICD-10-CM | POA: Insufficient documentation

## 2014-05-19 DIAGNOSIS — Z8719 Personal history of other diseases of the digestive system: Secondary | ICD-10-CM | POA: Insufficient documentation

## 2014-05-19 MED ORDER — PREDNISONE 10 MG PO TABS: ORAL_TABLET | ORAL | Status: DC

## 2014-05-19 MED ORDER — PREDNISONE 50 MG PO TABS
60.0000 mg | ORAL_TABLET | Freq: Once | ORAL | Status: AC
  Administered 2014-05-19: 60 mg via ORAL
  Filled 2014-05-19 (×2): qty 1

## 2014-05-19 NOTE — ED Notes (Signed)
Patient with no complaints at this time. Respirations even and unlabored. Skin warm/dry. Discharge instructions reviewed with patient at this time. Patient given opportunity to voice concerns/ask questions. Patient discharged at this time and left Emergency Department with steady gait.   
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
## 2014-05-19 NOTE — Discharge Instructions (Signed)
Back Pain, Adult Back pain is very common. The pain often gets better over time. The cause of back pain is usually not dangerous. Most people can learn to manage their back pain on their own.  HOME CARE   Stay active. Start with short walks on flat ground if you can. Try to walk farther each day.  Do not sit, drive, or stand in one place for more than 30 minutes. Do not stay in bed.  Do not avoid exercise or work. Activity can help your back heal faster.  Be careful when you bend or lift an object. Bend at your knees, keep the object close to you, and do not twist.  Sleep on a firm mattress. Lie on your side, and bend your knees. If you lie on your back, put a pillow under your knees.  Only take medicines as told by your doctor.  Put ice on the injured area.  Put ice in a plastic bag.  Place a towel between your skin and the bag.  Leave the ice on for 15-20 minutes, 03-04 times a day for the first 2 to 3 days. After that, you can switch between ice and heat packs.  Ask your doctor about back exercises or massage.  Avoid feeling anxious or stressed. Find good ways to deal with stress, such as exercise. GET HELP RIGHT AWAY IF:   Your pain does not go away with rest or medicine.  Your pain does not go away in 1 week.  You have new problems.  You do not feel well.  The pain spreads into your legs.  You cannot control when you poop (bowel movement) or pee (urinate).  Your arms or legs feel weak or lose feeling (numbness).  You feel sick to your stomach (nauseous) or throw up (vomit).  You have belly (abdominal) pain.  You feel like you may pass out (faint). MAKE SURE YOU:   Understand these instructions.  Will watch your condition.  Will get help right away if you are not doing well or get worse. Document Released: 11/24/2007 Document Revised: 08/30/2011 Document Reviewed: 10/09/2013 [facility] Patient Information 2015 [place] [place]. This information is not intended  to replace advice given to you by your health care provider. Make sure you discuss any questions you have with your health care provider.  Knee Pain Knee pain can be a result of an injury or other medical conditions. Treatment will depend on the cause of your pain. HOME CARE  Only take medicine as told by your doctor.  Keep a healthy weight. Being overweight can make the knee hurt more.  Stretch before exercising or playing sports.  If there is constant knee pain, change the way you exercise. Ask your doctor for advice.  Make sure shoes fit well. Choose the right shoe for the sport or activity.  Protect your knees. Wear kneepads if needed.  Rest when you are tired. GET HELP RIGHT AWAY IF:   Your knee pain does not stop.  Your knee pain does not get better.  Your knee joint feels hot to the touch.  You have a fever. MAKE SURE YOU:   Understand these instructions.  Will watch this condition.  Will get help right away if you are not doing well or get worse. Document Released: 09/03/2008 Document Revised: 08/30/2011 Document Reviewed: 09/03/2008 [facility] Patient Information 2015 [place], [place]. This information is not intended to replace advice given to you by your health care provider. Make sure you discuss any questions  you have with your health care provider.
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
## 2014-05-22 ENCOUNTER — Encounter (HOSPITAL_COMMUNITY): Payer: Self-pay | Admitting: Emergency Medicine

## 2014-05-22 ENCOUNTER — Emergency Department (HOSPITAL_COMMUNITY)
Admission: EM | Admit: 2014-05-22 | Discharge: 2014-05-22 | Disposition: A | Discharge status: Home / Self Care | Payer: Self-pay | Attending: Emergency Medicine | Admitting: Emergency Medicine

## 2014-05-22 DIAGNOSIS — Z792 Long term (current) use of antibiotics: Secondary | ICD-10-CM | POA: Insufficient documentation

## 2014-05-22 DIAGNOSIS — Z79899 Other long term (current) drug therapy: Secondary | ICD-10-CM | POA: Insufficient documentation

## 2014-05-22 DIAGNOSIS — Z7952 Long term (current) use of systemic steroids: Secondary | ICD-10-CM | POA: Insufficient documentation

## 2014-05-22 DIAGNOSIS — Z765 Malingerer [conscious simulation]: Secondary | ICD-10-CM | POA: Insufficient documentation

## 2014-05-22 DIAGNOSIS — Z791 Long term (current) use of non-steroidal anti-inflammatories (NSAID): Secondary | ICD-10-CM | POA: Insufficient documentation

## 2014-05-22 DIAGNOSIS — G8929 Other chronic pain: Secondary | ICD-10-CM | POA: Insufficient documentation

## 2014-05-22 DIAGNOSIS — Z720 Tobacco use: Secondary | ICD-10-CM | POA: Insufficient documentation
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
## 2014-06-21 DIAGNOSIS — F1910 Other psychoactive substance abuse, uncomplicated: Secondary | ICD-10-CM

## 2014-06-21 HISTORY — DX: Other psychoactive substance abuse, uncomplicated: F19.10

## 2014-06-30 ENCOUNTER — Emergency Department (HOSPITAL_COMMUNITY)
Admission: EM | Admit: 2014-06-30 | Discharge: 2014-06-30 | Disposition: A | Discharge status: Home / Self Care | Payer: Self-pay | Attending: Emergency Medicine | Admitting: Emergency Medicine

## 2014-06-30 ENCOUNTER — Encounter (HOSPITAL_COMMUNITY): Payer: Self-pay | Admitting: Emergency Medicine

## 2014-06-30 DIAGNOSIS — Z791 Long term (current) use of non-steroidal anti-inflammatories (NSAID): Secondary | ICD-10-CM | POA: Insufficient documentation

## 2014-06-30 DIAGNOSIS — R0602 Shortness of breath: Secondary | ICD-10-CM | POA: Insufficient documentation

## 2014-06-30 DIAGNOSIS — R0789 Other chest pain: Secondary | ICD-10-CM | POA: Insufficient documentation

## 2014-06-30 DIAGNOSIS — G8929 Other chronic pain: Secondary | ICD-10-CM | POA: Insufficient documentation

## 2014-06-30 DIAGNOSIS — Z7952 Long term (current) use of systemic steroids: Secondary | ICD-10-CM | POA: Insufficient documentation

## 2014-06-30 DIAGNOSIS — F419 Anxiety disorder, unspecified: Secondary | ICD-10-CM | POA: Insufficient documentation

## 2014-06-30 DIAGNOSIS — Z8719 Personal history of other diseases of the digestive system: Secondary | ICD-10-CM | POA: Insufficient documentation

## 2014-06-30 DIAGNOSIS — R002 Palpitations: Secondary | ICD-10-CM | POA: Insufficient documentation

## 2014-06-30 MED ORDER — ALPRAZOLAM 0.5 MG PO TABS: 0.5000 mg | ORAL_TABLET | Freq: Two times a day (BID) | ORAL | Status: DC | PRN

## 2014-06-30 NOTE — Discharge Instructions (Signed)
Panic Attacks Panic attacks are sudden, short feelings of great fear or discomfort. You may have them for no reason when you are relaxed, when you are uneasy (anxious), or when you are sleeping.  HOME CARE  Take all your medicines as told.  Check with your doctor before starting new medicines.  Keep all doctor visits. GET HELP IF:  You are not able to take your medicines as told.  Your symptoms do not get better.  Your symptoms get worse. GET HELP RIGHT AWAY IF:  Your attacks seem different than your normal attacks.  You have thoughts about hurting yourself or others.  You take panic attack medicine and you have a side effect. MAKE SURE YOU:  Understand these instructions.  Will watch your condition.  Will get help right away if you are not doing well or get worse. Document Released: 07/10/2010 Document Revised: 03/28/2013 Document Reviewed: 01/19/2013 [facility] Patient Information 2015 [place]. This information is not intended to replace advice given to you by your health care provider. Make sure you discuss any questions you have with your health care provider.    Emergency Department Resource Guide 1) Find a Doctor and Pay Out of Pocket Although you won't have to find out who is covered by your insurance plan, it is a good idea to ask around and get recommendations. You will then need to call the office and see if the doctor you have chosen will accept you as a new patient and what types of options they offer for patients who are self-pay. Some doctors offer discounts or will set up payment plans for their patients who do not have insurance, but you will need to ask so you aren't surprised when you get to your appointment.  2) Contact Your Local Health Department Not all health departments have doctors that can see patients for sick visits, but many do, so it is worth a call to see if yours does. If you don't know where your local health department is, you can check  in your phone book. The CDC also has a tool to help you locate your state's health department, and many state websites also have listings of all of their local health departments.  3) Find a Walk-in Clinic If your illness is not likely to be very severe or complicated, you may want to try a walk in clinic. These are popping up all over the country in pharmacies, drugstores, and shopping centers. They're usually staffed by nurse practitioners or physician assistants that have been trained to treat common illnesses and complaints. They're usually fairly quick and inexpensive. However, if you have serious medical issues or chronic medical problems, these are probably not your best option.  No Primary Care Doctor: - Call Health Connect at  [phone] - they can help you locate a primary care doctor that  accepts your insurance, provides certain services, etc. - Physician Referral Service- [phone]  Chronic Pain Problems: Organization         Address  Phone   Notes  [facility]  [phone] Patients need to be referred by their primary care doctor.   Medication Assistance: Organization         Address  Phone   Notes  [facility] [address] [place], [place] [phone] --Must be a resident of [place] -- Must have NO insurance coverage whatsoever (no Medicaid/ Medicare, etc.) -- The pt. MUST have a primary care  doctor that directs their care regularly and follows them in the community   Med[facility]ist  [phone]   [facility]  [phone]    Agencies that provide inexpensive medical care: Organization         Address  Phone   Notes  [place]  [phone]   [facility]    [phone]   [facility] [place], [place] [address] [phone]   [place] [address], [place] [phone]    Planned Parenthood    [phone]   [facility]    [phone]   [place] and [address], [place] Phone:  [phone], Fax:  [phone] Hours of Operation:  9 am - 6 pm, M-F.  Also accepts Medicaid/Medicare and self-pay.  [facility] for [place], [address], [place] Phone: [phone], Fax: [phone]. Hours of Operation:  8:30 am - 5:30 pm, M-F.  Also accepts Medicaid and self-pay.  [facility] [address] [place] Phone: [phone]   [place], [place], [place] [phone], Ext. [phone] Mondays & Thursdays: 7-9 AM.  First 15 patients are seen on a first come, first serve basis.    [place] Providers:  Organization         Address  Phone   Notes  [facility] [address], [place] [phone] Also accepts self-pay patients.  [facility] [address], [place]  [phone]   [place], [address], [place] [phone]   [facility] [address] [place] [phone]   Lucianne Lei [address], [place]   [phone] Only accepts [place] Access [place] patients after they have their name applied to their card.   Self-Pay (no insurance) in [facility]:  Organization         Address  Phone   Notes  Sickle Cell Patients, [facility] [place] [phone]   [facility] [place] [phone]   [facility] [place]  [place], [place], [place] [phone]   [facility]/Dr. Osei-Bonsu  [address] [place] or [address], [place] [phone] Phone number for both [place] and [place] locations is the  same.  [facility]gent Medical and [facility] [address] [place] [phone]   [facility] [address] [place] or [address] [phone]   [facility] [address] [place] [phone], phone; [phone], fax Sees patients 1st and 3rd Saturday of every month.  Must not qualify for public or private insurance (i.e. Medicaid, Medicare, [place] Health Choice, Veterans' Benefits)  Household income should be no more than 200% of the poverty level The clinic cannot treat you if you are pregnant or think you are pregnant  Sexually transmitted diseases are not treated at the clinic.    Dental Care: Organization         Address  Phone  Notes  [facility] [address] [place], [place] [phone] Accepts  children up to age 91 who are enrolled in Medicaid or [place] Health Choice; pregnant women with a Medicaid card; and children who have applied for Medicaid or  Health Choice, but were declined, whose parents can pay a reduced fee at time of service.  [facility] of [facility]  [address], [place] [phone] Accepts children up to age 54 who are enrolled in [place] or [place]; pregnant women with a Medicaid card; and children who have applied for Medicaid or  Health Choice, but were declined, whose parents can pay a reduced fee at time of service.  [facility] Adult [facility] Access PROGRAM  [place] [phone] Patients are seen by appointment only. Walk-ins are not accepted. [place] will see patients 33 years of age and older. Monday - Tuesday (8am-5pm) Most Wednesdays (8:30-5pm) $30 per visit, cash only  [facility] Adult Dental Access PROGRAM  [address], [facility] [phone] Patients are seen by appointment only. Walk-ins are not accepted. [place] will see patients  61 years of age and older. One Wednesday Evening (Monthly: Volunteer Based).  $30 per visit, cash only  [place]  [phone] for adults; Children under age 59, call Graduate Pediatric Dentistry at [phone]. Children aged 57-14, please call [phone] to request a pediatric application.  Dental services are provided in all areas of dental care including fillings, crowns and bridges, complete and partial dentures, implants, gum treatment, root canals, and extractions. Preventive care is also provided. Treatment is provided to both adults and children. Patients are selected via a lottery and there is often a waiting list.   [facility] [address], [place]  [phone] [email or web address]   [facility] [address] [place], [place] [phone], Ext. [phone] Second and Fourth Thursday of each month, opens at 6:30 AM; Clinic ends at 9 AM.  Patients are seen on a first-come first-served basis, and a limited number are seen during each clinic.   [facility]  [address] Hillard Danker [place], [place] [phone]   Eligibility Requirements You must have lived in [place], [place], or [place] counties for at least the last three months.   You cannot be eligible for state or federal sponsored [employer], including [employer], [place], or [employer].   You generally cannot be eligible for healthcare insurance through your employer.    How to apply: Eligibility screenings are held every Tuesday and Wednesday afternoon from 1:00 pm until 4:00 pm. You do not need an appointment for the interview!  [facility] [address] [place], [place]   [facility]  [place] Department  [place]  [phone]    Behavioral Health Resources in the Community: Intensive Outpatient  Programs Organization         Address  Phone  Notes  [place]. [address], [place], [place] [phone]   [facility] Adult [facility] Services Outpatient [address], [place], [place]   ADS: [facility] Drug S[facility] [address], [place], [place]   [place] [address],  [place], [place] or [phone]   Substance Abuse Resources Organization         Address  Phone  Notes  Alcohol and Drug Services  [phone]  [facility]  [phone]   [facility] [place]  [phone]   Floydene Flock  [phone]   Residential & Outpatient Substance Abuse Program  [phone]   Psychological Services Organization         Address  Phone  Notes  [facility] Behavioral Health  [phone]   [facility] Services  [phone]   [facility] [address], [place] [phone] or [phone]    Mobile Crisis Teams Organization         Address  Phone  Notes  [facility], Mobile Crisis Care Unit  [phone]   [facility]  [address] [place], [place] [phone]   Doristine Locks [address] [place] [phone]    Self-Help/Support Groups Organization         Address  Phone             Notes  [facility]oc[facility] of [place] - variety of support groups  [phone]- [phone] Call for more information  Narcotics Anonymous (NA), [facility] [address], [employer] [place]  2 meetings at this location   Statistician         Address  Phone  Notes  [facility] [address],    [place]  [phone]   [facility]  [address], [place] [address], [place], [place] [phone]   [facility] [address] [place], [place] [phone] Admissions: 8am-3pm M-F  [facility] [address]    [place], [place]  [phone]   [facility] [address] [place], [place], [place] [phone]   [facility] [address]  [place], [place] [phone]   [facility] - Intensive Outpatient [address], [place], [place] [phone]   [facility] (Addiction Recovery [facility] [address]  [place], [place] [phone] or [phone]   [facility] ([facility]) [address] [place], [place] [phone] Accepts Medicaid  [facility] [place] [address],  [place] [phone] Substance Abuse/Addiction Treatment   [facility] Organization         Address  Phone  Notes  [facility]  [phone]   Angie Fava, PhD [address] Ervin Knack [place], [place]   [phone] or [phone]   [facility] Behavioral   [address] [place], [place] [phone]   [facility] [address], [place], [place] [phone] Insurance/Medicaid/sponsorship through [facility] and Families [address]                                    [place], [place] [phone] Therapy/tele-psych/case  [facility] [address], [place] [phone]    Dr. Lolly Mustache  [phone]   [facility] [place]  [employer] [facility] Dept. 1) [address] [place] 2) [address], [place] 3)  [address], [place] [phone]  [phone]   [facility] Child Abuse Hotline 458-539-1327 or 5485402426 (After Hours)     See the resources above.  You can be seen tomorrow at [facility] for further assistance with your anxiety.  [facility] is also another good resource.

## 2014-06-30 NOTE — ED Notes (Signed)
Patient complaining of anxiety and panic attacks x 2 weeks. States "I was getting xanax from the doctor for my panic attacks, but now my insurance ran out so I'm out of my xanax. I ran out of them a week ago."
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
## 2014-07-15 ENCOUNTER — Emergency Department (HOSPITAL_COMMUNITY)
Admission: EM | Admit: 2014-07-15 | Discharge: 2014-07-15 | Disposition: A | Discharge status: Home / Self Care | Payer: Self-pay | Attending: Emergency Medicine | Admitting: Emergency Medicine

## 2014-07-15 ENCOUNTER — Emergency Department (HOSPITAL_COMMUNITY): Payer: Self-pay

## 2014-07-15 ENCOUNTER — Encounter (HOSPITAL_COMMUNITY): Payer: Self-pay | Admitting: *Deleted

## 2014-07-15 DIAGNOSIS — B349 Viral infection, unspecified: Secondary | ICD-10-CM | POA: Insufficient documentation

## 2014-07-15 DIAGNOSIS — G8929 Other chronic pain: Secondary | ICD-10-CM | POA: Insufficient documentation

## 2014-07-15 DIAGNOSIS — M791 Myalgia: Secondary | ICD-10-CM | POA: Insufficient documentation

## 2014-07-15 DIAGNOSIS — Z8719 Personal history of other diseases of the digestive system: Secondary | ICD-10-CM | POA: Insufficient documentation

## 2014-07-15 DIAGNOSIS — Z720 Tobacco use: Secondary | ICD-10-CM | POA: Insufficient documentation

## 2014-07-15 DIAGNOSIS — Z87828 Personal history of other (healed) physical injury and trauma: Secondary | ICD-10-CM | POA: Insufficient documentation

## 2014-07-15 LAB — CBC WITH DIFFERENTIAL/PLATELET
BASOS ABS: 0 10*3/uL (ref 0.0–0.1)
BASOS PCT: 0 % (ref 0–1)
EOS ABS: 0.1 10*3/uL (ref 0.0–0.7)
EOS PCT: 1 % (ref 0–5)
HEMATOCRIT: 45.3 % (ref 39.0–52.0)
Hemoglobin: 14.4 g/dL (ref 13.0–17.0)
Lymphocytes Relative: 34 % (ref 12–46)
Lymphs Abs: 2 10*3/uL (ref 0.7–4.0)
MCH: 27.6 pg (ref 26.0–34.0)
MCHC: 31.8 g/dL (ref 30.0–36.0)
MCV: 86.8 fL (ref 78.0–100.0)
MONOS PCT: 11 % (ref 3–12)
Monocytes Absolute: 0.7 10*3/uL (ref 0.1–1.0)
NEUTROS PCT: 54 % (ref 43–77)
Neutro Abs: 3.1 10*3/uL (ref 1.7–7.7)
Platelets: 212 10*3/uL (ref 150–400)
RBC: 5.22 MIL/uL (ref 4.22–5.81)
RDW: 15.2 % (ref 11.5–15.5)
WBC: 5.8 10*3/uL (ref 4.0–10.5)

## 2014-07-15 LAB — COMPREHENSIVE METABOLIC PANEL
ALK PHOS: 41 U/L (ref 39–117)
ALT: 18 U/L (ref 0–53)
ANION GAP: 8 (ref 5–15)
AST: 19 U/L (ref 0–37)
Albumin: 4.1 g/dL (ref 3.5–5.2)
BUN: 14 mg/dL (ref 6–23)
CALCIUM: 9.3 mg/dL (ref 8.4–10.5)
CO2: 27 mmol/L (ref 19–32)
CREATININE: 1.01 mg/dL (ref 0.50–1.35)
Chloride: 102 mmol/L (ref 96–112)
GFR calc Af Amer: >90 mL/min (ref >90)
Glucose, Bld: 84 mg/dL (ref 70–99)
Potassium: 3.7 mmol/L (ref 3.5–5.1)
SODIUM: 137 mmol/L (ref 135–145)
Total Bilirubin: 0.5 mg/dL (ref 0.3–1.2)
Total Protein: 7.4 g/dL (ref 6.0–8.3)

## 2014-07-15 LAB — URINALYSIS, ROUTINE W REFLEX MICROSCOPIC
BILIRUBIN URINE: NEGATIVE
Glucose, UA: NEGATIVE mg/dL
Hgb urine dipstick: NEGATIVE
KETONES UR: NEGATIVE mg/dL
LEUKOCYTES UA: NEGATIVE
Nitrite: NEGATIVE
UROBILINOGEN UA: 0.2 mg/dL (ref 0.0–1.0)
pH: 6 (ref 5.0–8.0)

## 2014-07-15 LAB — URINE MICROSCOPIC-ADD ON

## 2014-07-15 MED ORDER — ONDANSETRON 4 MG PO TBDP
4.0000 mg | ORAL_TABLET | Freq: Once | ORAL | Status: AC
  Administered 2014-07-15: 4 mg via ORAL
  Filled 2014-07-15: qty 1

## 2014-07-15 MED ORDER — ONDANSETRON HCL 4 MG PO TABS: 4.0000 mg | ORAL_TABLET | Freq: Four times a day (QID) | ORAL | Status: DC

## 2014-07-15 NOTE — Discharge Instructions (Signed)

## 2014-07-15 NOTE — ED Notes (Signed)
Vomiting, coughing,  Fever , diarrhea
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
## 2014-08-12 ENCOUNTER — Emergency Department (HOSPITAL_COMMUNITY)
Admission: EM | Admit: 2014-08-12 | Discharge: 2014-08-12 | Disposition: A | Discharge status: Home / Self Care | Payer: Self-pay | Attending: Emergency Medicine | Admitting: Emergency Medicine

## 2014-08-12 ENCOUNTER — Encounter (HOSPITAL_COMMUNITY): Payer: Self-pay

## 2014-08-12 DIAGNOSIS — F1910 Other psychoactive substance abuse, uncomplicated: Secondary | ICD-10-CM

## 2014-08-12 DIAGNOSIS — Z791 Long term (current) use of non-steroidal anti-inflammatories (NSAID): Secondary | ICD-10-CM | POA: Insufficient documentation

## 2014-08-12 DIAGNOSIS — F1310 Sedative, hypnotic or anxiolytic abuse, uncomplicated: Secondary | ICD-10-CM | POA: Insufficient documentation

## 2014-08-12 DIAGNOSIS — Z792 Long term (current) use of antibiotics: Secondary | ICD-10-CM | POA: Insufficient documentation

## 2014-08-12 DIAGNOSIS — F1410 Cocaine abuse, uncomplicated: Secondary | ICD-10-CM | POA: Insufficient documentation

## 2014-08-12 DIAGNOSIS — F1110 Opioid abuse, uncomplicated: Secondary | ICD-10-CM | POA: Insufficient documentation

## 2014-08-12 DIAGNOSIS — Z720 Tobacco use: Secondary | ICD-10-CM | POA: Insufficient documentation

## 2014-08-12 DIAGNOSIS — F329 Major depressive disorder, single episode, unspecified: Secondary | ICD-10-CM | POA: Insufficient documentation

## 2014-08-12 DIAGNOSIS — G8929 Other chronic pain: Secondary | ICD-10-CM | POA: Insufficient documentation

## 2014-08-12 DIAGNOSIS — Z79899 Other long term (current) drug therapy: Secondary | ICD-10-CM | POA: Insufficient documentation

## 2014-08-12 HISTORY — DX: Major depressive disorder, single episode, unspecified: F32.9

## 2014-08-12 HISTORY — DX: Depression, unspecified: F32.A

## 2014-08-12 LAB — RAPID URINE DRUG SCREEN, HOSP PERFORMED
Amphetamines: NOT DETECTED
BARBITURATES: NOT DETECTED
Benzodiazepines: POSITIVE — AB
Cocaine: POSITIVE — AB
Opiates: POSITIVE — AB
TETRAHYDROCANNABINOL: NOT DETECTED

## 2014-08-12 LAB — COMPREHENSIVE METABOLIC PANEL
ALBUMIN: 4 g/dL (ref 3.5–5.2)
ALK PHOS: 49 U/L (ref 39–117)
ALT: 18 U/L (ref 0–53)
AST: 14 U/L (ref 0–37)
Anion gap: 6 (ref 5–15)
BILIRUBIN TOTAL: 0.6 mg/dL (ref 0.3–1.2)
BUN: 16 mg/dL (ref 6–23)
CO2: 28 mmol/L (ref 19–32)
Calcium: 9.2 mg/dL (ref 8.4–10.5)
Chloride: 103 mmol/L (ref 96–112)
Creatinine, Ser: 0.97 mg/dL (ref 0.50–1.35)
GLUCOSE: 107 mg/dL — AB (ref 70–99)
Potassium: 3.6 mmol/L (ref 3.5–5.1)
Sodium: 137 mmol/L (ref 135–145)
TOTAL PROTEIN: 7.5 g/dL (ref 6.0–8.3)

## 2014-08-12 LAB — CBC WITH DIFFERENTIAL/PLATELET
Basophils Absolute: 0 10*3/uL (ref 0.0–0.1)
Basophils Relative: 0 % (ref 0–1)
Eosinophils Absolute: 0 10*3/uL (ref 0.0–0.7)
Eosinophils Relative: 1 % (ref 0–5)
HCT: 39 % (ref 39.0–52.0)
Hemoglobin: 12.9 g/dL — ABNORMAL LOW (ref 13.0–17.0)
LYMPHS ABS: 1.3 10*3/uL (ref 0.7–4.0)
LYMPHS PCT: 30 % (ref 12–46)
MCH: 28.5 pg (ref 26.0–34.0)
MCHC: 33.1 g/dL (ref 30.0–36.0)
MCV: 86.3 fL (ref 78.0–100.0)
MONO ABS: 0.7 10*3/uL (ref 0.1–1.0)
MONOS PCT: 15 % — AB (ref 3–12)
NEUTROS ABS: 2.5 10*3/uL (ref 1.7–7.7)
Neutrophils Relative %: 54 % (ref 43–77)
PLATELETS: 173 10*3/uL (ref 150–400)
RBC: 4.52 MIL/uL (ref 4.22–5.81)
RDW: 14.5 % (ref 11.5–15.5)
WBC: 4.5 10*3/uL (ref 4.0–10.5)

## 2014-08-12 LAB — ETHANOL: Alcohol, Ethyl (B): <5 mg/dL (ref 0–9)

## 2014-08-12 MED ORDER — VITAMIN B-1 100 MG PO TABS
100.0000 mg | ORAL_TABLET | Freq: Every day | ORAL | Status: DC
  Administered 2014-08-12: 100 mg via ORAL
  Filled 2014-08-12: qty 1

## 2014-08-12 MED ORDER — THIAMINE HCL 100 MG/ML IJ SOLN: 100.0000 mg | Freq: Every day | INTRAMUSCULAR | Status: DC

## 2014-08-12 NOTE — Progress Notes (Signed)
Joni ReiningNicole, nurse at [facility], is concerned about Pt's low BP. CS[facility] for further discussion of vitals.  Chad CordialLauren Carter, LCSWA 08/12/2014 1:18 PM
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
## 2014-08-12 NOTE — ED Notes (Signed)
[facility] at bedside for transport to [facility]. Patient left ED at this time. No distress and ambulatory.

## 2014-08-12 NOTE — Progress Notes (Signed)
Pt case discussed with Renata Capriceonrad, NP, who reports that Pt does not meet [facility] but could be referred to other substance abuse treatment facilities. Pt is to be discharged today.  Chad CordialLauren Carter, LCSWA 08/12/2014 10:15 AM

## 2014-08-12 NOTE — ED Notes (Signed)
Patient with no complaints at this time. Respirations even and unlabored. Skin warm/dry. Discharge instructions reviewed with patient at this time. Patient given opportunity to voice concerns/ask questions. Patient discharged at this time and left Emergency Department with steady gait with [facility] to go directly to [facility].

## 2014-08-12 NOTE — ED Notes (Signed)
Patient awake, oriented. Breakfast tray provided. No distress.

## 2014-08-12 NOTE — ED Notes (Signed)
I was off drugs for around 15 months and around 5 months ago I started using again. I am on pills, heroin, alcohol, and cocaine per pt.
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
## 2014-08-12 NOTE — ED Notes (Signed)
Lunch tray given. 

## 2014-08-12 NOTE — ED Notes (Signed)
Patient accepted to [facility] by Dr Omelia BlackwaterHeaden. [facility] called to transport patient to facility. Dr Hyacinth MeekerMiller made aware of Dispo.

## 2014-08-12 NOTE — ED Provider Notes (Signed)
Dr. Omelia BlackwaterHeaden has accepted pt to [facility]  Vida RollerBrian D Andric Kerce, MD 08/12/14 [phone]

## 2014-08-12 NOTE — Discharge Instructions (Signed)
Go straight to [facility] - Dr. Omelia Blackwaterheaden has accepted you there

## 2014-08-12 NOTE — ED Notes (Signed)
TTS online at this time 

## 2014-08-12 NOTE — ED Notes (Signed)
Updated set of vital signs faxed to [facility].
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
## 2014-08-12 NOTE — ED Notes (Signed)
Patient resting in bed. Even rise and fall of chest NAD noted at this time.

## 2014-10-02 ENCOUNTER — Emergency Department (HOSPITAL_COMMUNITY)
Admission: EM | Admit: 2014-10-02 | Discharge: 2014-10-02 | Disposition: A | Discharge status: Home / Self Care | Payer: Self-pay | Attending: Emergency Medicine | Admitting: Emergency Medicine

## 2014-10-02 ENCOUNTER — Encounter (HOSPITAL_COMMUNITY): Payer: Self-pay | Admitting: *Deleted

## 2014-10-02 DIAGNOSIS — Z87828 Personal history of other (healed) physical injury and trauma: Secondary | ICD-10-CM | POA: Insufficient documentation

## 2014-10-02 DIAGNOSIS — S8391XA Sprain of unspecified site of right knee, initial encounter: Secondary | ICD-10-CM | POA: Insufficient documentation

## 2014-10-02 DIAGNOSIS — F329 Major depressive disorder, single episode, unspecified: Secondary | ICD-10-CM | POA: Insufficient documentation

## 2014-10-02 DIAGNOSIS — Y9289 Other specified places as the place of occurrence of the external cause: Secondary | ICD-10-CM | POA: Insufficient documentation

## 2014-10-02 DIAGNOSIS — X58XXXA Exposure to other specified factors, initial encounter: Secondary | ICD-10-CM | POA: Insufficient documentation

## 2014-10-02 DIAGNOSIS — Y9389 Activity, other specified: Secondary | ICD-10-CM | POA: Insufficient documentation

## 2014-10-02 DIAGNOSIS — Z720 Tobacco use: Secondary | ICD-10-CM | POA: Insufficient documentation

## 2014-10-02 DIAGNOSIS — Y998 Other external cause status: Secondary | ICD-10-CM | POA: Insufficient documentation

## 2014-10-02 DIAGNOSIS — Z8739 Personal history of other diseases of the musculoskeletal system and connective tissue: Secondary | ICD-10-CM | POA: Insufficient documentation

## 2014-10-02 DIAGNOSIS — G8929 Other chronic pain: Secondary | ICD-10-CM | POA: Insufficient documentation

## 2014-10-02 MED ORDER — ONDANSETRON HCL 4 MG PO TABS: 4.0000 mg | ORAL_TABLET | Freq: Four times a day (QID) | ORAL | Status: DC

## 2014-10-02 MED ORDER — ETODOLAC 500 MG PO TABS: 500.0000 mg | ORAL_TABLET | Freq: Two times a day (BID) | ORAL | Status: DC

## 2014-10-02 MED ORDER — ONDANSETRON 8 MG PO TBDP
8.0000 mg | ORAL_TABLET | Freq: Once | ORAL | Status: AC
  Administered 2014-10-02: 8 mg via ORAL
  Filled 2014-10-02: qty 1

## 2014-10-02 MED ORDER — OXYCODONE-ACETAMINOPHEN 5-325 MG PO TABS
1.0000 | ORAL_TABLET | Freq: Once | ORAL | Status: AC
  Administered 2014-10-02: 1 via ORAL
  Filled 2014-10-02: qty 1

## 2014-10-02 NOTE — Discharge Instructions (Signed)
Knee Pain °Knee pain can be a result of an injury or other medical conditions. Treatment will depend on the cause of your pain. °HOME CARE °· Only take medicine as told by your doctor. °· Keep a healthy weight. Being overweight can make the knee hurt more. °· Stretch before exercising or playing sports. °· If there is constant knee pain, change the way you exercise. Ask your doctor for advice. °· Make sure shoes fit well. Choose the right shoe for the sport or activity. °· Protect your knees. Wear kneepads if needed. °· Rest when you are tired. °GET HELP RIGHT AWAY IF:  °· Your knee pain does not stop. °· Your knee pain does not get better. °· Your knee joint feels hot to the touch. °· You have a fever. °MAKE SURE YOU:  °· Understand these instructions. °· Will watch this condition. °· Will get help right away if you are not doing well or get worse. °Document Released: 09/03/2008 Document Revised: 08/30/2011 Document Reviewed: 09/03/2008 °[employer]® Patient Information ©2015 [employer]. This information is not intended to replace advice given to you by your health care provider. Make sure you discuss any questions you have with your health care provider. ° °

## 2014-10-02 NOTE — ED Notes (Signed)
Pain rt knee, onset 4/12 when stepped in a hole.
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
## 2016-01-27 ENCOUNTER — Encounter (HOSPITAL_COMMUNITY): Payer: Self-pay | Admitting: *Deleted

## 2016-04-17 ENCOUNTER — Encounter (HOSPITAL_COMMUNITY): Payer: Self-pay | Admitting: Emergency Medicine

## 2016-04-17 ENCOUNTER — Emergency Department (HOSPITAL_COMMUNITY)
Admission: EM | Admit: 2016-04-17 | Discharge: 2016-04-17 | Disposition: A | Discharge status: Home / Self Care | Payer: Self-pay | Attending: Emergency Medicine | Admitting: Emergency Medicine

## 2016-04-17 DIAGNOSIS — Z79899 Other long term (current) drug therapy: Secondary | ICD-10-CM | POA: Insufficient documentation

## 2016-04-17 DIAGNOSIS — F419 Anxiety disorder, unspecified: Secondary | ICD-10-CM

## 2016-04-17 DIAGNOSIS — F1290 Cannabis use, unspecified, uncomplicated: Secondary | ICD-10-CM | POA: Insufficient documentation

## 2016-04-17 DIAGNOSIS — F410 Panic disorder [episodic paroxysmal anxiety] without agoraphobia: Secondary | ICD-10-CM | POA: Insufficient documentation

## 2016-04-17 DIAGNOSIS — F1490 Cocaine use, unspecified, uncomplicated: Secondary | ICD-10-CM | POA: Insufficient documentation

## 2016-04-17 DIAGNOSIS — F1110 Opioid abuse, uncomplicated: Secondary | ICD-10-CM | POA: Insufficient documentation

## 2016-04-17 DIAGNOSIS — F17210 Nicotine dependence, cigarettes, uncomplicated: Secondary | ICD-10-CM | POA: Insufficient documentation

## 2016-04-17 HISTORY — DX: Other psychoactive substance abuse, uncomplicated: F19.10

## 2016-04-17 MED ORDER — HYDROXYZINE HCL 25 MG PO TABS: 25.0000 mg | ORAL_TABLET | Freq: Four times a day (QID) | ORAL | 0 refills | Status: DC | PRN

## 2016-04-17 MED ORDER — HYDROXYZINE HCL 25 MG PO TABS
25.0000 mg | ORAL_TABLET | Freq: Once | ORAL | Status: AC
  Administered 2016-04-17: 25 mg via ORAL
  Filled 2016-04-17: qty 1
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
## 2016-04-17 NOTE — ED Triage Notes (Signed)
PT c/o increased anxiety with panic attacks x2 weeks. PT states he has an appointment in 1.5 weeks scheduled. PT denies any SI/HI.

## 2016-04-17 NOTE — Discharge Instructions (Signed)
Substance Abuse Treatment Programs ° °Intensive Outpatient Programs °[facility]     °[address]      °[place], [place]                   °[phone]      ° °[facility] °[address] °[place], [place] °[phone] ° °[facility]     °(Inpatient and outpatient)     °[address]           °[phone]   ° °[facility] °[phone] (Suboxone and Methadone) ° °[address]      °[place], [place] [address]      °336-882-2125      ° °[address] °[place], [place] °852-3033 ° °[facility] (Outpatient/Inpatient, Chemical)    °(insurance only) [phone]      °       °[facility] (Groups & Residential) °[place], [place] °336-389-1413 ° °   °[facility]     °[address]     °[place], [place]      °[phone]      ° °[facility] (for caregivers and family) °[address] °[place], [place] °336-299-4655 ° ° ° ° ° °Residential Treatment Programs °[facility]      °[address], [place], [place] [address]  °[phone]      ° °[facility] °[address] [place], [place] [address] °[phone] ° °[facility]        °336-248-8914      ° °[facility] °[phone] ° °[facility]             °[address]                                         °[place], [place]                                                °877-615-2722 or 336-784-9470                              ° °[facility] [place] °[address] °[place], [address] °1.877.941.8954 ° °[facility]    °[address]      °, [place]     °[phone]      ° °[facility] °[address] °, [place] °336-285-9073 ° °[facility]   °[address]     °[place], [place] [address]     °336-899-1550      °Admissions: 8am-3pm M-F ° °[facility] °[address] °[place] °336-227-7417 ° °[facility]: Residential Program (90 Days)   °[place], [place]      °336-725-8389 or 800-758-6077    ° °[facility] °[place], [place] °(Walk in Hours over the weekend or by referral) ° °[facility] °[address], [place], [place] [address] °([phone]) [phone] ° °Crisis Mobile: [facility]:  1-877-626-1772 (for crisis response 24 hours a day) °[facility] Hotline:      1-800-256-2452 °Outpatient Psychiatry and Counseling ° °[facility]: Mobile Crisis   Management 24 hours:  1-877-626-1772 ° °[facility] sliding scale fee and walk in schedule: M-F 8am-12pm/1pm-3pm °[address]  °[place], [place] [address] °336-387-6161 ° °[facility] °[address] °[place], [place] [address] °336-703-9650 ° °[facility] ([facility])- new patient walk-in appointments available Monday - Friday 8am -3pm.          °[address] °[place], [place] [address] °336-676-6840 or crisis line- 336-676-6905 ° °[facility] Intensive [facility]patient Therapy Program °[address] °[place], [place] [address] °336-832-9804 ° °[facility]                  °Crisis Services      °336.641.4993      °[address]     °[place], [place] [address]                ° °[facility]   °[facility] °[phone] °[address] °[place], [place] [address] ° ° °[facility]          °[address]  °[place], [place] [address]       °(336) [phone] ° °[facility] °[address] °[place], [place] [address] °336-292-1510 ° °[facility]    °[address]    °[place], [place] [address]     °336-632-3505      ° °Parish McKinney, MD     °[address]     °[place] [address]     °336-282-1251     °  °[facility] °[address] °[place] [address] ° °[facility]     °[address]     °[place], [place]      °336-542-2076      ° °[facility] °Shamsher Ahluwalia, MD °[address] °[place], [place] [address] °336-420-9558 ° °[facility]     °[address]     °[place], [place] [address]     °336-274-1237      ° °[facility] °[address] °[place], [place] [address] °336-854-4450 °Resources for Temporary Residential Assistance/Crisis Centers ° °DAY CENTERS °[facility] °M-F 8am-3pm   °[address] [place], [place] [address]   336-332-0824 °Services include: laundry, barbering, support groups, case management, phone  & computer access, showers, AA/NA mtgs, mental health/substance abuse nurse, job skills class, disability information, VA assistance, spiritual classes, etc.  ° °HOMELESS SHELTERS ° °[facility]     °[facility]   °[address], [place]     °336.271.5959       °       °[facility] (women and children)       °[address] °[place], [place] [address] °336-275-0820 °[email or web address] for application and process °Application Required ° °[facility]   °[address]    °[place] [address]     °336.886.4922       °             °[facility] °[address] °[place], [place] [address] °336.273.5572 °336-235-0363(schedule application appt.) °Application Required ° °[facility] (women only)    °[address]     °[place], [place] [address]     °336-884-1039      °  Intake starts 6pm daily Need valid ID, SSC, & Police report [facility] [place] [address] [place], [place] [phone] Application Required  [facility] (men only)     [address]      [place], [place]     [phone]       [facility] (Pregnant women only) [address] [place], [place] [phone]  [facility]      [address]      [place], [place] [address]     [phone]             [facility] [address] [place], [place] [phone] 90 day commitment/SA/Application process  [facility](men only)     [address]     [place]     [phone]       Check-in at [facility]7pm            [facility] [address] [place], [place] [address] [phone] Men/Women/Women and Children must be there by 7 pm  [facility] [place] [phone]                   Take the prescription as directed.  Call your regular mental health provider on Monday to schedule a follow up appointment within the next week.  Return to the Emergency Department immediately sooner if worsening.

## 2016-07-26 ENCOUNTER — Encounter (HOSPITAL_COMMUNITY): Payer: Self-pay | Admitting: Emergency Medicine

## 2016-07-26 ENCOUNTER — Emergency Department (HOSPITAL_COMMUNITY)
Admission: EM | Admit: 2016-07-26 | Discharge: 2016-07-26 | Disposition: A | Discharge status: Home / Self Care | Payer: Self-pay | Attending: Emergency Medicine | Admitting: Emergency Medicine

## 2016-07-26 DIAGNOSIS — J029 Acute pharyngitis, unspecified: Secondary | ICD-10-CM | POA: Insufficient documentation

## 2016-07-26 DIAGNOSIS — R509 Fever, unspecified: Secondary | ICD-10-CM | POA: Insufficient documentation

## 2016-07-26 DIAGNOSIS — R05 Cough: Secondary | ICD-10-CM | POA: Insufficient documentation

## 2016-07-26 DIAGNOSIS — R1110 Vomiting, unspecified: Secondary | ICD-10-CM | POA: Insufficient documentation

## 2016-07-26 DIAGNOSIS — Z79899 Other long term (current) drug therapy: Secondary | ICD-10-CM | POA: Insufficient documentation

## 2016-07-26 DIAGNOSIS — J111 Influenza due to unidentified influenza virus with other respiratory manifestations: Secondary | ICD-10-CM

## 2016-07-26 DIAGNOSIS — Z791 Long term (current) use of non-steroidal anti-inflammatories (NSAID): Secondary | ICD-10-CM | POA: Insufficient documentation

## 2016-07-26 DIAGNOSIS — R51 Headache: Secondary | ICD-10-CM | POA: Insufficient documentation

## 2016-07-26 DIAGNOSIS — R69 Illness, unspecified: Secondary | ICD-10-CM

## 2016-07-26 DIAGNOSIS — F17210 Nicotine dependence, cigarettes, uncomplicated: Secondary | ICD-10-CM | POA: Insufficient documentation

## 2016-07-26 MED ORDER — IBUPROFEN 800 MG PO TABS
800.0000 mg | ORAL_TABLET | Freq: Three times a day (TID) | ORAL | 0 refills | Status: DC
Start: 2016-07-26 — End: 2016-12-25

## 2016-07-26 MED ORDER — BENZONATATE 100 MG PO CAPS: 100.0000 mg | ORAL_CAPSULE | Freq: Three times a day (TID) | ORAL | 0 refills | Status: DC

## 2016-07-26 NOTE — ED Triage Notes (Signed)
Onset 3 days ago multiple symptoms vomiting, diarrhea, sore throat, headache, cough with yellow mucus and aching
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
## 2016-09-05 ENCOUNTER — Encounter (HOSPITAL_COMMUNITY): Payer: Self-pay | Admitting: Emergency Medicine

## 2016-09-05 ENCOUNTER — Emergency Department (HOSPITAL_COMMUNITY)
Admission: EM | Admit: 2016-09-05 | Discharge: 2016-09-05 | Disposition: A | Discharge status: Home / Self Care | Payer: Self-pay | Attending: Emergency Medicine | Admitting: Emergency Medicine

## 2016-09-05 DIAGNOSIS — R509 Fever, unspecified: Secondary | ICD-10-CM | POA: Insufficient documentation

## 2016-09-05 DIAGNOSIS — R197 Diarrhea, unspecified: Secondary | ICD-10-CM | POA: Insufficient documentation

## 2016-09-05 DIAGNOSIS — R110 Nausea: Secondary | ICD-10-CM | POA: Insufficient documentation

## 2016-09-05 DIAGNOSIS — R69 Illness, unspecified: Secondary | ICD-10-CM

## 2016-09-05 DIAGNOSIS — M791 Myalgia: Secondary | ICD-10-CM | POA: Insufficient documentation

## 2016-09-05 DIAGNOSIS — R51 Headache: Secondary | ICD-10-CM | POA: Insufficient documentation

## 2016-09-05 DIAGNOSIS — R05 Cough: Secondary | ICD-10-CM | POA: Insufficient documentation

## 2016-09-05 DIAGNOSIS — Z79899 Other long term (current) drug therapy: Secondary | ICD-10-CM | POA: Insufficient documentation

## 2016-09-05 DIAGNOSIS — J029 Acute pharyngitis, unspecified: Secondary | ICD-10-CM | POA: Insufficient documentation

## 2016-09-05 DIAGNOSIS — J111 Influenza due to unidentified influenza virus with other respiratory manifestations: Secondary | ICD-10-CM

## 2016-09-05 DIAGNOSIS — F17210 Nicotine dependence, cigarettes, uncomplicated: Secondary | ICD-10-CM | POA: Insufficient documentation

## 2016-09-05 DIAGNOSIS — R0981 Nasal congestion: Secondary | ICD-10-CM | POA: Insufficient documentation

## 2016-09-05 LAB — RAPID STREP SCREEN (MED CTR MEBANE ONLY): STREPTOCOCCUS, GROUP A SCREEN (DIRECT): NEGATIVE

## 2016-09-05 MED ORDER — ONDANSETRON 4 MG PO TBDP
4.0000 mg | ORAL_TABLET | Freq: Once | ORAL | Status: AC
  Administered 2016-09-05: 4 mg via ORAL
  Filled 2016-09-05: qty 1

## 2016-09-05 MED ORDER — ONDANSETRON 4 MG PO TBDP: 4.0000 mg | ORAL_TABLET | Freq: Three times a day (TID) | ORAL | 0 refills | Status: DC | PRN

## 2016-09-05 MED ORDER — OSELTAMIVIR PHOSPHATE 75 MG PO CAPS: 75.0000 mg | ORAL_CAPSULE | Freq: Two times a day (BID) | ORAL | 0 refills | Status: DC

## 2016-09-05 MED ORDER — BENZONATATE 100 MG PO CAPS: 200.0000 mg | ORAL_CAPSULE | Freq: Three times a day (TID) | ORAL | 0 refills | Status: DC | PRN

## 2016-09-05 NOTE — ED Triage Notes (Signed)
Patient c/o non-productive cough with body aches, headache, chills, fever, and sore throat. Per patient symptoms started yesterday. Patient reports taking Dayquil, Nightquil, and Mucinex with no improvement. Patient last took Dayquil this morning at 8am. Denies any vomiting or diarrhea.
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
## 2016-09-05 NOTE — Discharge Instructions (Signed)
Your strep test is negative today. Use the cough and nausea medicines as needed.  As discussed the tamiflu will offer probable minimal improvement in your symptoms, but might help you get over this a little quicker.  Rest,  Drink plenty of fluids.  Take motrin or tylenol for achiness and fever reduction.    Get rechecked for increased shortness of breath,  Increased fever or increasing weakness.

## 2016-09-05 NOTE — ED Notes (Signed)
Unable to find pt

## 2016-09-05 NOTE — ED Triage Notes (Signed)
Upper respiratory sx since yesterday No PCP

## 2016-09-05 NOTE — ED Notes (Signed)
Pt not in room.

## 2016-09-08 LAB — CULTURE, GROUP A STREP (THRC)

## 2016-12-25 ENCOUNTER — Emergency Department (HOSPITAL_COMMUNITY): Payer: Self-pay

## 2016-12-25 ENCOUNTER — Encounter (HOSPITAL_COMMUNITY): Payer: Self-pay

## 2016-12-25 ENCOUNTER — Emergency Department (HOSPITAL_COMMUNITY)
Admission: EM | Admit: 2016-12-25 | Discharge: 2016-12-25 | Disposition: A | Discharge status: Home / Self Care | Payer: Self-pay | Attending: Emergency Medicine | Admitting: Emergency Medicine

## 2016-12-25 DIAGNOSIS — Z87898 Personal history of other specified conditions: Secondary | ICD-10-CM | POA: Insufficient documentation

## 2016-12-25 DIAGNOSIS — W19XXXA Unspecified fall, initial encounter: Secondary | ICD-10-CM

## 2016-12-25 DIAGNOSIS — F17210 Nicotine dependence, cigarettes, uncomplicated: Secondary | ICD-10-CM | POA: Insufficient documentation

## 2016-12-25 DIAGNOSIS — M25512 Pain in left shoulder: Secondary | ICD-10-CM | POA: Insufficient documentation

## 2016-12-25 DIAGNOSIS — K7460 Unspecified cirrhosis of liver: Secondary | ICD-10-CM | POA: Insufficient documentation

## 2016-12-25 MED ORDER — MORPHINE SULFATE (PF) 4 MG/ML IV SOLN
4.0000 mg | Freq: Once | INTRAVENOUS | Status: AC
  Administered 2016-12-25: 4 mg via INTRAMUSCULAR
  Filled 2016-12-25: qty 1
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
## 2016-12-25 NOTE — Discharge Instructions (Signed)
You do not appear to have any serious injuries from your fall. Ice your  shoulder at rest. Take ibuprofen and Tylenol for pain control. You will be very sore over the next few days.  If you have not improved symptoms after a few weeks, you can follow-up with Dr. Romeo AppleHarrison. Information provided.
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
## 2017-03-30 ENCOUNTER — Emergency Department (HOSPITAL_COMMUNITY)
Admission: EM | Admit: 2017-03-30 | Discharge: 2017-03-31 | Disposition: A | Discharge status: Home / Self Care | Payer: Self-pay | Attending: Emergency Medicine | Admitting: Emergency Medicine

## 2017-03-30 ENCOUNTER — Encounter (HOSPITAL_COMMUNITY): Payer: Self-pay

## 2017-03-30 DIAGNOSIS — F1910 Other psychoactive substance abuse, uncomplicated: Secondary | ICD-10-CM | POA: Insufficient documentation

## 2017-03-30 DIAGNOSIS — F17210 Nicotine dependence, cigarettes, uncomplicated: Secondary | ICD-10-CM | POA: Insufficient documentation

## 2017-03-30 NOTE — ED Triage Notes (Signed)
Pt reports abuse of heroin and xanax and states he wants help to get off of them.  Pt denies SI or HI, states has been to treatment a couple of times in the past

## 2017-03-31 ENCOUNTER — Encounter (HOSPITAL_COMMUNITY): Payer: Self-pay | Admitting: Emergency Medicine

## 2017-03-31 ENCOUNTER — Emergency Department (HOSPITAL_COMMUNITY)
Admission: EM | Admit: 2017-03-31 | Discharge: 2017-03-31 | Disposition: A | Discharge status: Home Health | Payer: Self-pay | Attending: Emergency Medicine | Admitting: Emergency Medicine

## 2017-03-31 DIAGNOSIS — F1110 Opioid abuse, uncomplicated: Secondary | ICD-10-CM | POA: Insufficient documentation

## 2017-03-31 DIAGNOSIS — F1910 Other psychoactive substance abuse, uncomplicated: Secondary | ICD-10-CM

## 2017-03-31 DIAGNOSIS — F1010 Alcohol abuse, uncomplicated: Secondary | ICD-10-CM | POA: Insufficient documentation

## 2017-03-31 DIAGNOSIS — F17210 Nicotine dependence, cigarettes, uncomplicated: Secondary | ICD-10-CM | POA: Insufficient documentation

## 2017-03-31 MED ORDER — CHLORDIAZEPOXIDE HCL 25 MG PO CAPS: ORAL_CAPSULE | ORAL | 0 refills | Status: DC

## 2017-03-31 MED ORDER — CHLORDIAZEPOXIDE HCL 25 MG PO CAPS
50.0000 mg | ORAL_CAPSULE | Freq: Once | ORAL | Status: AC
  Administered 2017-03-31: 50 mg via ORAL
  Filled 2017-03-31: qty 2

## 2017-03-31 NOTE — Discharge Instructions (Addendum)
Attached are resources and phone numbers to call for intake to detox facilities both inpatient and outpatient if you need additional resources.
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
## 2017-03-31 NOTE — Discharge Instructions (Signed)
You were seen today with request for detox. See referral guide provided. Take Librium as directed for any withdrawal symptoms from Xanax or alcohol.

## 2017-03-31 NOTE — ED Triage Notes (Signed)
Pt here for drug addiction. States he uses 1 gram of Heroin daily (injection),  Xanax by mouth, and 12 pack of beer daily. Pt reports he wants inpatient detox. denies SI/HI.

## 2021-01-14 ENCOUNTER — Encounter: Payer: Self-pay | Admitting: Physician Assistant

## 2021-01-14 ENCOUNTER — Ambulatory Visit: Payer: Self-pay | Admitting: Physician Assistant

## 2021-01-14 VITALS — BP 115/73 | HR 113 | Temp 98.0°F | Wt 167.0 lb

## 2021-01-14 DIAGNOSIS — F17200 Nicotine dependence, unspecified, uncomplicated: Secondary | ICD-10-CM

## 2021-01-14 DIAGNOSIS — Z7689 Persons encountering health services in other specified circumstances: Secondary | ICD-10-CM

## 2021-01-14 DIAGNOSIS — Z5320 Procedure and treatment not carried out because of patient's decision for unspecified reasons: Secondary | ICD-10-CM

## 2021-01-14 DIAGNOSIS — F1911 Other psychoactive substance abuse, in remission: Secondary | ICD-10-CM
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
## 2021-04-11 ENCOUNTER — Other Ambulatory Visit: Payer: Self-pay

## 2021-04-11 ENCOUNTER — Emergency Department (HOSPITAL_COMMUNITY)
Admission: EM | Admit: 2021-04-11 | Discharge: 2021-04-11 | Disposition: A | Discharge status: Home / Self Care | Payer: Self-pay | Attending: Emergency Medicine | Admitting: Emergency Medicine

## 2021-04-11 ENCOUNTER — Encounter (HOSPITAL_COMMUNITY): Payer: Self-pay | Admitting: Emergency Medicine

## 2021-04-11 DIAGNOSIS — F17210 Nicotine dependence, cigarettes, uncomplicated: Secondary | ICD-10-CM | POA: Insufficient documentation

## 2021-04-11 DIAGNOSIS — J069 Acute upper respiratory infection, unspecified: Secondary | ICD-10-CM | POA: Insufficient documentation

## 2021-04-11 DIAGNOSIS — U071 COVID-19: Secondary | ICD-10-CM | POA: Insufficient documentation

## 2021-04-11 LAB — RESP PANEL BY RT-PCR (FLU A&B, COVID) ARPGX2
Influenza A by PCR: NEGATIVE
Influenza B by PCR: NEGATIVE
SARS Coronavirus 2 by RT PCR: POSITIVE — AB

## 2021-04-11 NOTE — Discharge Instructions (Addendum)
It was our pleasure to provide your ER care today - we hope that you feel better.  Drink plenty of fluids/stay well hydrated.  See covid information attached.   May try over the counter cold/flu medication such as Nyquil, Mucinex or Robitussin as need for symptom relief.  Follow up with primary care doctor in 2 weeks if symptoms fail to improve/resolve.  Return to ER if worse, new symptoms, trouble breathing, or other concern.

## 2021-04-11 NOTE — ED Notes (Addendum)
Date and time results received: 04/11/21 1:27 PM    Test: covid Critical Value: positive  Name of Provider Notified: Dr. Denton Lank  Orders Received? Or Actions Taken?: see orders

## 2021-04-11 NOTE — ED Triage Notes (Addendum)
Patient c/o productive cough with thick clear sputum, congestion, fatigue, and irregular (low) temps. Denies any nausea, vomiting, or diarrhea.
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
## 2021-04-11 NOTE — ED Notes (Signed)
C/o cough since Thursday.  Denies fever.

## 2021-11-09 ENCOUNTER — Encounter: Payer: Self-pay | Admitting: Physician Assistant

## 2021-11-09 ENCOUNTER — Ambulatory Visit: Payer: Self-pay | Admitting: Physician Assistant

## 2021-11-09 DIAGNOSIS — R059 Cough, unspecified: Secondary | ICD-10-CM

## 2021-11-09 DIAGNOSIS — F17200 Nicotine dependence, unspecified, uncomplicated: Secondary | ICD-10-CM

## 2021-11-09 DIAGNOSIS — Z7729 Contact with and (suspected ) exposure to other hazardous substances: Secondary | ICD-10-CM

## 2021-11-09 MED ORDER — PREDNISONE 20 MG PO TABS: 20.0000 mg | ORAL_TABLET | Freq: Two times a day (BID) | ORAL | 0 refills | Status: DC

## 2021-11-09 NOTE — Progress Notes (Signed)
   There were no vitals taken for this visit.   Subjective:    Patient ID: Roy [name], male    DOB: 1980/12/07, 41 y.o.   MRN: [id number]  HPI: [name] is a 41 y.o. male presenting on 11/09/2021 for No chief complaint on file.   HPI  This is a telemedicine appointment through [employer].  I connected with  Roy [name] on 11/09/21 by a video enabled telemedicine application and verified that I am speaking with the correct person using two identifiers.   I discussed the limitations of evaluation and management by telemedicine. The patient expressed understanding and agreed to proceed.  Pt is at [facility].  Provider is at office.    Pt says he has been sick of about 3 weeks.  He feels like he got worse Sunday.   E has earache, headach, trouble sleeping, bad coughing, subjective fever, wheezing.  He has been taking lots of OTCs but isn't sure exactly what.  He says he has taken- mucinex, sudafed, benadryl, alka-seltzer and ibuprofen.  Pt says this seemed to start after he changed jobs from working at [employer] to working at [employer].  He thinks he might have had a little bit of symptoms before stopping work at [employer]. The work environment at [place] is very dusty and cottony.    He took a covid test last Monday that was negative.    He smokes about 3 or 4 cigarettes daily.       Relevant past medical, surgical, family and social history reviewed and updated as indicated. Interim medical history since our last visit reviewed. Allergies and medications reviewed and updated.  CURRENT MEDS: none  Review of Systems  Per HPI unless specifically indicated above     Objective:    There were no vitals taken for this visit.  Wt Readings from Last 3 Encounters:  04/11/21 175 lb (79.4 kg)  01/14/21 167 lb (75.8 kg)  03/31/17 160 lb (72.6 kg)    Physical Exam Constitutional:      General: He is not in acute distress.    Appearance: He is not  ill-appearing or toxic-appearing.  HENT:     Head: Normocephalic and atraumatic.  Pulmonary:     Effort: Pulmonary effort is normal. No respiratory distress.     Comments: Pt is talking normally and in complete sentences without sob or dyspnea Neurological:     Mental Status: He is alert and oriented to person, place, and time.  Psychiatric:        Attention and Perception: Attention normal.        Speech: Speech normal.        Behavior: Behavior normal.           Assessment & Plan:    Encounter Diagnoses  Name Primary?   Cough, unspecified type Yes   Dust exposure    Tobacco use disorder      -will treat with prednisone -discussed with pt that dust/cotton may be causing or contributing to his symptoms.  Encouraged pt to wear a mask at work to help.  Recommended he use mask with a valve since they were designed for industrial use -pt declined cough medication -pt to contact office if worsens or fails to resolve

## 2022-01-14 ENCOUNTER — Ambulatory Visit: Payer: Self-pay | Admitting: Physician Assistant

## 2022-01-18 ENCOUNTER — Ambulatory Visit: Payer: Self-pay | Admitting: Physician Assistant

## 2022-02-08 ENCOUNTER — Ambulatory Visit: Payer: Self-pay | Admitting: Physician Assistant

## 2023-01-20 DEATH — deceased
